# Patient Record
Sex: Female | Born: 1935 | Race: White | Hispanic: No | Marital: Married | Smoking: Never smoker
Health system: Southern US, Community
[De-identification: ages and names within clinical notes are randomized; demographics above are authoritative.]

## PROBLEM LIST (undated history)

## (undated) DIAGNOSIS — I4891 Unspecified atrial fibrillation: Secondary | ICD-10-CM

## (undated) DIAGNOSIS — E7800 Pure hypercholesterolemia, unspecified: Secondary | ICD-10-CM

## (undated) DIAGNOSIS — E119 Type 2 diabetes mellitus without complications: Secondary | ICD-10-CM

## (undated) DIAGNOSIS — I10 Essential (primary) hypertension: Secondary | ICD-10-CM

---

## 1998-07-19 ENCOUNTER — Other Ambulatory Visit: Admission: RE | Admit: 1998-07-19 | Discharge: 1998-07-19 | Payer: Self-pay | Admitting: Internal Medicine

## 1999-08-21 ENCOUNTER — Encounter: Admission: RE | Admit: 1999-08-21 | Discharge: 1999-08-21 | Payer: Self-pay | Admitting: Internal Medicine

## 1999-08-21 ENCOUNTER — Encounter: Payer: Self-pay | Admitting: Internal Medicine

## 2000-10-21 ENCOUNTER — Encounter: Admission: RE | Admit: 2000-10-21 | Discharge: 2000-10-21 | Payer: Self-pay | Admitting: Internal Medicine

## 2000-10-21 ENCOUNTER — Encounter: Payer: Self-pay | Admitting: Internal Medicine

## 2001-03-01 ENCOUNTER — Other Ambulatory Visit: Admission: RE | Admit: 2001-03-01 | Discharge: 2001-03-01 | Payer: Self-pay | Admitting: Internal Medicine

## 2001-11-05 ENCOUNTER — Encounter: Payer: Self-pay | Admitting: Internal Medicine

## 2001-11-05 ENCOUNTER — Encounter: Admission: RE | Admit: 2001-11-05 | Discharge: 2001-11-05 | Payer: Self-pay | Admitting: Internal Medicine

## 2001-11-10 ENCOUNTER — Encounter: Payer: Self-pay | Admitting: Internal Medicine

## 2001-11-10 ENCOUNTER — Encounter: Admission: RE | Admit: 2001-11-10 | Discharge: 2001-11-10 | Payer: Self-pay | Admitting: Internal Medicine

## 2002-11-17 ENCOUNTER — Encounter: Payer: Self-pay | Admitting: Internal Medicine

## 2002-11-17 ENCOUNTER — Encounter: Admission: RE | Admit: 2002-11-17 | Discharge: 2002-11-17 | Payer: Self-pay | Admitting: Internal Medicine

## 2003-11-29 ENCOUNTER — Ambulatory Visit (HOSPITAL_COMMUNITY): Admission: RE | Admit: 2003-11-29 | Discharge: 2003-11-29 | Payer: Self-pay | Admitting: Internal Medicine

## 2004-12-03 ENCOUNTER — Ambulatory Visit (HOSPITAL_COMMUNITY): Admission: RE | Admit: 2004-12-03 | Discharge: 2004-12-03 | Payer: Self-pay | Admitting: Internal Medicine

## 2005-05-27 ENCOUNTER — Ambulatory Visit (HOSPITAL_COMMUNITY): Admission: RE | Admit: 2005-05-27 | Discharge: 2005-05-27 | Payer: Self-pay | Admitting: *Deleted

## 2005-05-27 ENCOUNTER — Encounter (INDEPENDENT_AMBULATORY_CARE_PROVIDER_SITE_OTHER): Payer: Self-pay | Admitting: *Deleted

## 2005-12-11 ENCOUNTER — Ambulatory Visit (HOSPITAL_COMMUNITY): Admission: RE | Admit: 2005-12-11 | Discharge: 2005-12-11 | Payer: Self-pay | Admitting: Internal Medicine

## 2006-12-30 ENCOUNTER — Ambulatory Visit (HOSPITAL_COMMUNITY): Admission: RE | Admit: 2006-12-30 | Discharge: 2006-12-30 | Payer: Self-pay | Admitting: Internal Medicine

## 2007-10-12 ENCOUNTER — Encounter (INDEPENDENT_AMBULATORY_CARE_PROVIDER_SITE_OTHER): Payer: Self-pay | Admitting: *Deleted

## 2007-10-12 ENCOUNTER — Ambulatory Visit (HOSPITAL_COMMUNITY): Admission: RE | Admit: 2007-10-12 | Discharge: 2007-10-12 | Payer: Self-pay | Admitting: *Deleted

## 2007-12-31 ENCOUNTER — Ambulatory Visit (HOSPITAL_COMMUNITY): Admission: RE | Admit: 2007-12-31 | Discharge: 2007-12-31 | Payer: Self-pay | Admitting: Internal Medicine

## 2008-10-21 ENCOUNTER — Encounter: Admission: RE | Admit: 2008-10-21 | Discharge: 2008-10-21 | Payer: Self-pay | Admitting: Internal Medicine

## 2009-01-01 ENCOUNTER — Ambulatory Visit (HOSPITAL_COMMUNITY): Admission: RE | Admit: 2009-01-01 | Discharge: 2009-01-01 | Payer: Self-pay | Admitting: Internal Medicine

## 2009-06-18 ENCOUNTER — Other Ambulatory Visit: Admission: RE | Admit: 2009-06-18 | Discharge: 2009-06-18 | Payer: Self-pay | Admitting: Internal Medicine

## 2010-01-02 ENCOUNTER — Ambulatory Visit (HOSPITAL_COMMUNITY): Admission: RE | Admit: 2010-01-02 | Discharge: 2010-01-02 | Payer: Self-pay | Admitting: Internal Medicine

## 2010-08-06 NOTE — Op Note (Signed)
[name], [name]           ACCOUNT NO.:  [id number]   MEDICAL RECORD NO.:  [id number]          PATIENT TYPE:  AMB   LOCATION:  ENDO                         FACILITY:  [facility]   PHYSICIAN:  Georgiana Spinner, M.D.    DATE OF BIRTH:  1931/09/26   DATE OF PROCEDURE:  10/12/2007  DATE OF DISCHARGE:                               OPERATIVE REPORT   PROCEDURE:  Colonoscopy with polypectomy.   INDICATIONS:  Colon polyps.   ANESTHESIA:  Fentanyl 75 mcg, Versed 5 mg.   PROCEDURE:  With patient mildly sedated in the left lateral decubitus  position, the Pentax videoscopic colonoscope was inserted in the rectum  and passed under direct vision to the cecum identified by ileocecal  valve and appendiceal orifice, both of which were photographed.  There  was an AVM in the cecum which was also photographed.  From this point,  the endoscope was slowly withdrawn taking circumferential views of  colonic mucosa as we withdrew all the way to the rectum, stopping only  in the ascending colon where a small polyp was seen, photographed and  removed using snare cautery technique setting of 20/150 blended current.  We also stopped next in the descending colon where a second polyp was  seen.  It too was photographed and removed using snare cautery technique  and both polyps were retrieved by suctioning through a tissue trap.  In  the rectum, the endoscope was placed in retroflexed view to view the  anal canal from above.  The endoscope was straightened and withdrawn.  The patient's vital signs and pulse oximeter remained stable.  The  patient tolerated the procedure well without apparent complications.   FINDINGS:  1. Polyp of ascending and descending colon.  2. Internal hemorrhoids.  3. Arteriovenous malformation cecum.   PLAN:  Await biopsy reports.  The patient will call me for results and  follow-up with me as needed as an outpatient.           ______________________________  Georgiana Spinner,  M.D.     GMO/MEDQ  D:  10/12/2007  T:  10/12/2007  Job:  [id number]

## 2010-08-09 NOTE — Op Note (Signed)
NAMEDEB, LOUDIN           ACCOUNT NO.:  [id number]   MEDICAL RECORD NO.:  [id number]          PATIENT TYPE:  AMB   LOCATION:  ENDO                         FACILITY:  [facility]   PHYSICIAN:  Georgiana Spinner, M.D.    DATE OF BIRTH:  13-Dec-1931   DATE OF PROCEDURE:  05/27/2005  DATE OF DISCHARGE:                                 OPERATIVE REPORT   PROCEDURE:  Colonoscopy.   ENDOSCOPIST:  Georgiana Spinner, M.D.   INDICATIONS:  Colon polyps.   ANESTHESIA:  None further given.   DESCRIPTION OF PROCEDURE:  With the patient mildly sedated in the left  lateral decubitus position, the Olympus videoscopic colonoscope was inserted  into the rectum and passed under direct vision to the cecum, identified by  crow's foot of the cecum and ileocecal valve, both of which were  photographed.  We entered into the terminal ileum and advanced; this too  appeared normal and was photographed.  From this point, the colonoscope was  slowly withdrawn, taking circumferential views of the small bowel and  subsequently colonic mucosa as we withdrew all the way to the rectum,  stopping in the right colon near the hepatic flexure, where a number of  polyps were seen; some were photographed.  All were removed, some with snare-  cautery technique, some with hot-biopsy-forceps technique, all with the  setting of 20/200 blended current.  There was good hemostasis throughout and  the endoscope was withdrawn down all the way to the rectum, which appeared  normal on direct and showed small hemorrhoids on a retroflexed view.  The  endoscope was straightened and withdrawn.  The patient's vital signs and  pulse oximetry remained stable.  The patient tolerated the procedure well  without apparent complications.   FINDINGS:  A number of small polyps, 3-4 in count, of the right colon near  the hepatic flexure and just slightly distal to this, await biopsy report.   DISCHARGE INSTRUCTIONS:  The patient will call me for  results and follow up  with me as an outpatient.           ______________________________  Georgiana Spinner, M.D.     GMO/MEDQ  D:  05/27/2005  T:  05/28/2005  Job:  [id number]

## 2010-08-09 NOTE — Op Note (Signed)
NAMECHARLOT, GOUIN           ACCOUNT NO.:  [id number]   MEDICAL RECORD NO.:  [id number]          PATIENT TYPE:  AMB   LOCATION:  ENDO                         FACILITY:  [facility]   PHYSICIAN:  Georgiana Spinner, M.D.    DATE OF BIRTH:  May 11, 1931   DATE OF PROCEDURE:  05/27/2005  DATE OF DISCHARGE:                                 OPERATIVE REPORT   PROCEDURE PERFORMED:  Upper endoscopy.   INDICATIONS FOR PROCEDURE:  Abdominal pain.   ANESTHESIA:  Demerol 50 mg, Versed 5 mg.   DESCRIPTION OF PROCEDURE:  With the patient mildly sedated in the left  lateral decubitus position, the Olympus video endoscope was inserted in the  mouth, passed under direct vision through the esophagus, which appeared  normal except for beginning of possible early esophageal stricture. It was  photographed.  There was no evidence of Barrett's esophagus.  We entered  into the stomach.  Fundus, body, antrum, duodenal bulb and second portion of  the duodenum were visualized.  From this point the endoscope was slowly  withdrawn, taking circumferential views of the duodenal mucosa until the  endoscope had been pulled back in the stomach, placed on retroflexion to  view the stomach from below.  The endoscope was then straightened and  withdrawn, taking circumferential views of the remaining gastric and  esophageal mucosa.  Patient's vital signs and pulse oximeter remained  stable.  The patient tolerated the procedure well without apparent  complications.   FINDINGS:  Unremarkable examination.   PLAN:  Proceed to colonoscopy.           ______________________________  Georgiana Spinner, M.D.     GMO/MEDQ  D:  05/27/2005  T:  05/28/2005  Job:  [id number]

## 2010-11-29 ENCOUNTER — Other Ambulatory Visit (HOSPITAL_COMMUNITY): Payer: Self-pay | Admitting: Internal Medicine

## 2010-11-29 DIAGNOSIS — Z1231 Encounter for screening mammogram for malignant neoplasm of breast: Secondary | ICD-10-CM

## 2011-01-06 ENCOUNTER — Ambulatory Visit (HOSPITAL_COMMUNITY)
Admission: RE | Admit: 2011-01-06 | Discharge: 2011-01-06 | Disposition: A | Discharge status: Home / Self Care | Payer: Medicare Other | Source: Ambulatory Visit | Attending: Internal Medicine | Admitting: Internal Medicine

## 2011-01-06 DIAGNOSIS — Z1231 Encounter for screening mammogram for malignant neoplasm of breast: Secondary | ICD-10-CM | POA: Insufficient documentation

## 2011-06-05 DIAGNOSIS — E7800 Pure hypercholesterolemia, unspecified: Secondary | ICD-10-CM | POA: Diagnosis not present

## 2011-06-05 DIAGNOSIS — E119 Type 2 diabetes mellitus without complications: Secondary | ICD-10-CM | POA: Diagnosis not present

## 2011-06-05 DIAGNOSIS — I10 Essential (primary) hypertension: Secondary | ICD-10-CM | POA: Diagnosis not present

## 2011-06-11 DIAGNOSIS — E119 Type 2 diabetes mellitus without complications: Secondary | ICD-10-CM | POA: Diagnosis not present

## 2011-06-11 DIAGNOSIS — E875 Hyperkalemia: Secondary | ICD-10-CM | POA: Diagnosis not present

## 2011-06-11 DIAGNOSIS — I10 Essential (primary) hypertension: Secondary | ICD-10-CM | POA: Diagnosis not present

## 2011-06-11 DIAGNOSIS — E785 Hyperlipidemia, unspecified: Secondary | ICD-10-CM | POA: Diagnosis not present

## 2011-09-01 DIAGNOSIS — H43399 Other vitreous opacities, unspecified eye: Secondary | ICD-10-CM | POA: Diagnosis not present

## 2011-09-01 DIAGNOSIS — H04129 Dry eye syndrome of unspecified lacrimal gland: Secondary | ICD-10-CM | POA: Diagnosis not present

## 2011-09-01 DIAGNOSIS — H35369 Drusen (degenerative) of macula, unspecified eye: Secondary | ICD-10-CM | POA: Diagnosis not present

## 2011-09-01 DIAGNOSIS — H524 Presbyopia: Secondary | ICD-10-CM | POA: Diagnosis not present

## 2011-09-01 DIAGNOSIS — H2510 Age-related nuclear cataract, unspecified eye: Secondary | ICD-10-CM | POA: Diagnosis not present

## 2011-12-09 ENCOUNTER — Other Ambulatory Visit (HOSPITAL_COMMUNITY): Payer: Self-pay | Admitting: Internal Medicine

## 2011-12-09 DIAGNOSIS — Z1231 Encounter for screening mammogram for malignant neoplasm of breast: Secondary | ICD-10-CM

## 2011-12-12 DIAGNOSIS — E785 Hyperlipidemia, unspecified: Secondary | ICD-10-CM | POA: Diagnosis not present

## 2011-12-12 DIAGNOSIS — I10 Essential (primary) hypertension: Secondary | ICD-10-CM | POA: Diagnosis not present

## 2011-12-12 DIAGNOSIS — E119 Type 2 diabetes mellitus without complications: Secondary | ICD-10-CM | POA: Diagnosis not present

## 2011-12-17 DIAGNOSIS — E7800 Pure hypercholesterolemia, unspecified: Secondary | ICD-10-CM | POA: Diagnosis not present

## 2011-12-17 DIAGNOSIS — I10 Essential (primary) hypertension: Secondary | ICD-10-CM | POA: Diagnosis not present

## 2011-12-17 DIAGNOSIS — E119 Type 2 diabetes mellitus without complications: Secondary | ICD-10-CM | POA: Diagnosis not present

## 2011-12-17 DIAGNOSIS — Z23 Encounter for immunization: Secondary | ICD-10-CM | POA: Diagnosis not present

## 2012-01-07 ENCOUNTER — Ambulatory Visit (HOSPITAL_COMMUNITY)
Admission: RE | Admit: 2012-01-07 | Discharge: 2012-01-07 | Disposition: A | Discharge status: Home / Self Care | Payer: Medicare Other | Source: Ambulatory Visit | Attending: Internal Medicine | Admitting: Internal Medicine

## 2012-01-07 DIAGNOSIS — Z1231 Encounter for screening mammogram for malignant neoplasm of breast: Secondary | ICD-10-CM | POA: Diagnosis not present

## 2012-06-10 DIAGNOSIS — M949 Disorder of cartilage, unspecified: Secondary | ICD-10-CM | POA: Diagnosis not present

## 2012-06-10 DIAGNOSIS — M899 Disorder of bone, unspecified: Secondary | ICD-10-CM | POA: Diagnosis not present

## 2012-06-10 DIAGNOSIS — I10 Essential (primary) hypertension: Secondary | ICD-10-CM | POA: Diagnosis not present

## 2012-06-10 DIAGNOSIS — E119 Type 2 diabetes mellitus without complications: Secondary | ICD-10-CM | POA: Diagnosis not present

## 2012-06-15 DIAGNOSIS — M542 Cervicalgia: Secondary | ICD-10-CM | POA: Diagnosis not present

## 2012-06-15 DIAGNOSIS — E119 Type 2 diabetes mellitus without complications: Secondary | ICD-10-CM | POA: Diagnosis not present

## 2012-06-15 DIAGNOSIS — I10 Essential (primary) hypertension: Secondary | ICD-10-CM | POA: Diagnosis not present

## 2012-06-15 DIAGNOSIS — E7800 Pure hypercholesterolemia, unspecified: Secondary | ICD-10-CM | POA: Diagnosis not present

## 2012-06-15 DIAGNOSIS — M47812 Spondylosis without myelopathy or radiculopathy, cervical region: Secondary | ICD-10-CM | POA: Diagnosis not present

## 2012-06-15 DIAGNOSIS — M5030 Other cervical disc degeneration, unspecified cervical region: Secondary | ICD-10-CM | POA: Diagnosis not present

## 2012-09-02 DIAGNOSIS — H43399 Other vitreous opacities, unspecified eye: Secondary | ICD-10-CM | POA: Diagnosis not present

## 2012-09-02 DIAGNOSIS — H04129 Dry eye syndrome of unspecified lacrimal gland: Secondary | ICD-10-CM | POA: Diagnosis not present

## 2012-09-02 DIAGNOSIS — H353190 Nonexudative age-related macular degeneration, unspecified eye, stage unspecified: Secondary | ICD-10-CM | POA: Diagnosis not present

## 2012-09-02 DIAGNOSIS — H2510 Age-related nuclear cataract, unspecified eye: Secondary | ICD-10-CM | POA: Diagnosis not present

## 2012-10-13 DIAGNOSIS — Z86010 Personal history of colonic polyps: Secondary | ICD-10-CM | POA: Diagnosis not present

## 2012-10-26 DIAGNOSIS — K649 Unspecified hemorrhoids: Secondary | ICD-10-CM | POA: Diagnosis not present

## 2012-10-26 DIAGNOSIS — D126 Benign neoplasm of colon, unspecified: Secondary | ICD-10-CM | POA: Diagnosis not present

## 2012-10-26 DIAGNOSIS — Z1211 Encounter for screening for malignant neoplasm of colon: Secondary | ICD-10-CM | POA: Diagnosis not present

## 2012-10-26 DIAGNOSIS — K5730 Diverticulosis of large intestine without perforation or abscess without bleeding: Secondary | ICD-10-CM | POA: Diagnosis not present

## 2012-10-26 DIAGNOSIS — K5520 Angiodysplasia of colon without hemorrhage: Secondary | ICD-10-CM | POA: Diagnosis not present

## 2012-10-26 DIAGNOSIS — Z86010 Personal history of colonic polyps: Secondary | ICD-10-CM | POA: Diagnosis not present

## 2012-12-08 ENCOUNTER — Other Ambulatory Visit (HOSPITAL_COMMUNITY): Payer: Self-pay | Admitting: Internal Medicine

## 2012-12-08 DIAGNOSIS — Z1231 Encounter for screening mammogram for malignant neoplasm of breast: Secondary | ICD-10-CM

## 2012-12-13 DIAGNOSIS — I10 Essential (primary) hypertension: Secondary | ICD-10-CM | POA: Diagnosis not present

## 2012-12-13 DIAGNOSIS — E119 Type 2 diabetes mellitus without complications: Secondary | ICD-10-CM | POA: Diagnosis not present

## 2012-12-16 DIAGNOSIS — E119 Type 2 diabetes mellitus without complications: Secondary | ICD-10-CM | POA: Diagnosis not present

## 2012-12-16 DIAGNOSIS — E875 Hyperkalemia: Secondary | ICD-10-CM | POA: Diagnosis not present

## 2012-12-16 DIAGNOSIS — I10 Essential (primary) hypertension: Secondary | ICD-10-CM | POA: Diagnosis not present

## 2012-12-16 DIAGNOSIS — E7800 Pure hypercholesterolemia, unspecified: Secondary | ICD-10-CM | POA: Diagnosis not present

## 2012-12-16 DIAGNOSIS — Z23 Encounter for immunization: Secondary | ICD-10-CM | POA: Diagnosis not present

## 2013-01-10 ENCOUNTER — Ambulatory Visit (HOSPITAL_COMMUNITY)
Admission: RE | Admit: 2013-01-10 | Discharge: 2013-01-10 | Disposition: A | Discharge status: Home / Self Care | Payer: Medicare Other | Source: Ambulatory Visit | Attending: Internal Medicine | Admitting: Internal Medicine

## 2013-01-10 DIAGNOSIS — Z1231 Encounter for screening mammogram for malignant neoplasm of breast: Secondary | ICD-10-CM | POA: Diagnosis not present

## 2013-06-20 DIAGNOSIS — E559 Vitamin D deficiency, unspecified: Secondary | ICD-10-CM | POA: Diagnosis not present

## 2013-06-20 DIAGNOSIS — E119 Type 2 diabetes mellitus without complications: Secondary | ICD-10-CM | POA: Diagnosis not present

## 2013-06-20 DIAGNOSIS — I10 Essential (primary) hypertension: Secondary | ICD-10-CM | POA: Diagnosis not present

## 2013-06-23 DIAGNOSIS — I10 Essential (primary) hypertension: Secondary | ICD-10-CM | POA: Diagnosis not present

## 2013-06-23 DIAGNOSIS — M949 Disorder of cartilage, unspecified: Secondary | ICD-10-CM | POA: Diagnosis not present

## 2013-06-23 DIAGNOSIS — E7800 Pure hypercholesterolemia, unspecified: Secondary | ICD-10-CM | POA: Diagnosis not present

## 2013-06-23 DIAGNOSIS — E119 Type 2 diabetes mellitus without complications: Secondary | ICD-10-CM | POA: Diagnosis not present

## 2013-06-23 DIAGNOSIS — M899 Disorder of bone, unspecified: Secondary | ICD-10-CM | POA: Diagnosis not present

## 2013-11-01 DIAGNOSIS — E119 Type 2 diabetes mellitus without complications: Secondary | ICD-10-CM | POA: Diagnosis not present

## 2013-11-01 DIAGNOSIS — I10 Essential (primary) hypertension: Secondary | ICD-10-CM | POA: Diagnosis not present

## 2013-11-04 DIAGNOSIS — E7800 Pure hypercholesterolemia, unspecified: Secondary | ICD-10-CM | POA: Diagnosis not present

## 2013-11-04 DIAGNOSIS — M899 Disorder of bone, unspecified: Secondary | ICD-10-CM | POA: Diagnosis not present

## 2013-11-04 DIAGNOSIS — I10 Essential (primary) hypertension: Secondary | ICD-10-CM | POA: Diagnosis not present

## 2013-11-04 DIAGNOSIS — E119 Type 2 diabetes mellitus without complications: Secondary | ICD-10-CM | POA: Diagnosis not present

## 2013-11-18 DIAGNOSIS — H353190 Nonexudative age-related macular degeneration, unspecified eye, stage unspecified: Secondary | ICD-10-CM | POA: Diagnosis not present

## 2013-11-18 DIAGNOSIS — H25019 Cortical age-related cataract, unspecified eye: Secondary | ICD-10-CM | POA: Diagnosis not present

## 2013-11-18 DIAGNOSIS — H40019 Open angle with borderline findings, low risk, unspecified eye: Secondary | ICD-10-CM | POA: Diagnosis not present

## 2013-11-18 DIAGNOSIS — H2510 Age-related nuclear cataract, unspecified eye: Secondary | ICD-10-CM | POA: Diagnosis not present

## 2013-12-12 ENCOUNTER — Other Ambulatory Visit (HOSPITAL_COMMUNITY): Payer: Self-pay | Admitting: Internal Medicine

## 2013-12-12 DIAGNOSIS — Z1231 Encounter for screening mammogram for malignant neoplasm of breast: Secondary | ICD-10-CM

## 2014-01-11 ENCOUNTER — Ambulatory Visit (HOSPITAL_COMMUNITY)
Admission: RE | Admit: 2014-01-11 | Discharge: 2014-01-11 | Disposition: A | Discharge status: Home / Self Care | Payer: Medicare Other | Source: Ambulatory Visit | Attending: Internal Medicine | Admitting: Internal Medicine

## 2014-01-11 DIAGNOSIS — Z1231 Encounter for screening mammogram for malignant neoplasm of breast: Secondary | ICD-10-CM | POA: Insufficient documentation

## 2014-02-21 DIAGNOSIS — H40013 Open angle with borderline findings, low risk, bilateral: Secondary | ICD-10-CM | POA: Diagnosis not present

## 2014-05-05 DIAGNOSIS — I10 Essential (primary) hypertension: Secondary | ICD-10-CM | POA: Diagnosis not present

## 2014-05-05 DIAGNOSIS — M859 Disorder of bone density and structure, unspecified: Secondary | ICD-10-CM | POA: Diagnosis not present

## 2014-05-05 DIAGNOSIS — E118 Type 2 diabetes mellitus with unspecified complications: Secondary | ICD-10-CM | POA: Diagnosis not present

## 2014-05-10 DIAGNOSIS — E559 Vitamin D deficiency, unspecified: Secondary | ICD-10-CM | POA: Diagnosis not present

## 2014-05-10 DIAGNOSIS — I10 Essential (primary) hypertension: Secondary | ICD-10-CM | POA: Diagnosis not present

## 2014-05-10 DIAGNOSIS — E119 Type 2 diabetes mellitus without complications: Secondary | ICD-10-CM | POA: Diagnosis not present

## 2014-05-10 DIAGNOSIS — Z0000 Encounter for general adult medical examination without abnormal findings: Secondary | ICD-10-CM | POA: Diagnosis not present

## 2014-11-08 DIAGNOSIS — H25013 Cortical age-related cataract, bilateral: Secondary | ICD-10-CM | POA: Diagnosis not present

## 2014-11-08 DIAGNOSIS — E119 Type 2 diabetes mellitus without complications: Secondary | ICD-10-CM | POA: Diagnosis not present

## 2014-11-08 DIAGNOSIS — H524 Presbyopia: Secondary | ICD-10-CM | POA: Diagnosis not present

## 2014-11-08 DIAGNOSIS — E780 Pure hypercholesterolemia: Secondary | ICD-10-CM | POA: Diagnosis not present

## 2014-11-08 DIAGNOSIS — H40013 Open angle with borderline findings, low risk, bilateral: Secondary | ICD-10-CM | POA: Diagnosis not present

## 2014-11-08 DIAGNOSIS — E559 Vitamin D deficiency, unspecified: Secondary | ICD-10-CM | POA: Diagnosis not present

## 2014-11-08 DIAGNOSIS — I10 Essential (primary) hypertension: Secondary | ICD-10-CM | POA: Diagnosis not present

## 2014-11-08 DIAGNOSIS — H3531 Nonexudative age-related macular degeneration: Secondary | ICD-10-CM | POA: Diagnosis not present

## 2014-11-14 DIAGNOSIS — M859 Disorder of bone density and structure, unspecified: Secondary | ICD-10-CM | POA: Diagnosis not present

## 2014-11-14 DIAGNOSIS — E119 Type 2 diabetes mellitus without complications: Secondary | ICD-10-CM | POA: Diagnosis not present

## 2014-11-14 DIAGNOSIS — E780 Pure hypercholesterolemia: Secondary | ICD-10-CM | POA: Diagnosis not present

## 2014-11-14 DIAGNOSIS — I10 Essential (primary) hypertension: Secondary | ICD-10-CM | POA: Diagnosis not present

## 2014-12-20 DIAGNOSIS — M79641 Pain in right hand: Secondary | ICD-10-CM | POA: Diagnosis not present

## 2014-12-20 DIAGNOSIS — I10 Essential (primary) hypertension: Secondary | ICD-10-CM | POA: Diagnosis not present

## 2014-12-20 DIAGNOSIS — S6991XA Unspecified injury of right wrist, hand and finger(s), initial encounter: Secondary | ICD-10-CM | POA: Diagnosis not present

## 2015-03-06 ENCOUNTER — Other Ambulatory Visit: Payer: Self-pay

## 2015-03-06 DIAGNOSIS — Z1231 Encounter for screening mammogram for malignant neoplasm of breast: Secondary | ICD-10-CM

## 2015-04-05 ENCOUNTER — Ambulatory Visit
Admission: RE | Admit: 2015-04-05 | Discharge: 2015-04-05 | Disposition: A | Discharge status: Home / Self Care | Payer: Medicare Other | Source: Ambulatory Visit

## 2015-04-05 DIAGNOSIS — Z1231 Encounter for screening mammogram for malignant neoplasm of breast: Secondary | ICD-10-CM

## 2015-05-14 DIAGNOSIS — H04123 Dry eye syndrome of bilateral lacrimal glands: Secondary | ICD-10-CM | POA: Diagnosis not present

## 2015-05-14 DIAGNOSIS — H40013 Open angle with borderline findings, low risk, bilateral: Secondary | ICD-10-CM | POA: Diagnosis not present

## 2015-05-14 DIAGNOSIS — H1013 Acute atopic conjunctivitis, bilateral: Secondary | ICD-10-CM | POA: Diagnosis not present

## 2015-05-14 DIAGNOSIS — H524 Presbyopia: Secondary | ICD-10-CM | POA: Diagnosis not present

## 2015-05-17 DIAGNOSIS — E119 Type 2 diabetes mellitus without complications: Secondary | ICD-10-CM | POA: Diagnosis not present

## 2015-05-17 DIAGNOSIS — I10 Essential (primary) hypertension: Secondary | ICD-10-CM | POA: Diagnosis not present

## 2015-05-17 DIAGNOSIS — E559 Vitamin D deficiency, unspecified: Secondary | ICD-10-CM | POA: Diagnosis not present

## 2015-05-17 DIAGNOSIS — M859 Disorder of bone density and structure, unspecified: Secondary | ICD-10-CM | POA: Diagnosis not present

## 2015-05-22 DIAGNOSIS — I10 Essential (primary) hypertension: Secondary | ICD-10-CM | POA: Diagnosis not present

## 2015-05-22 DIAGNOSIS — E7800 Pure hypercholesterolemia, unspecified: Secondary | ICD-10-CM | POA: Diagnosis not present

## 2015-05-22 DIAGNOSIS — R739 Hyperglycemia, unspecified: Secondary | ICD-10-CM | POA: Diagnosis not present

## 2015-08-30 DIAGNOSIS — H1013 Acute atopic conjunctivitis, bilateral: Secondary | ICD-10-CM | POA: Diagnosis not present

## 2015-08-30 DIAGNOSIS — H40013 Open angle with borderline findings, low risk, bilateral: Secondary | ICD-10-CM | POA: Diagnosis not present

## 2015-08-30 DIAGNOSIS — H04123 Dry eye syndrome of bilateral lacrimal glands: Secondary | ICD-10-CM | POA: Diagnosis not present

## 2015-12-31 DIAGNOSIS — Z23 Encounter for immunization: Secondary | ICD-10-CM | POA: Diagnosis not present

## 2015-12-31 DIAGNOSIS — E7800 Pure hypercholesterolemia, unspecified: Secondary | ICD-10-CM | POA: Diagnosis not present

## 2015-12-31 DIAGNOSIS — E119 Type 2 diabetes mellitus without complications: Secondary | ICD-10-CM | POA: Diagnosis not present

## 2015-12-31 DIAGNOSIS — M859 Disorder of bone density and structure, unspecified: Secondary | ICD-10-CM | POA: Diagnosis not present

## 2015-12-31 DIAGNOSIS — Z0000 Encounter for general adult medical examination without abnormal findings: Secondary | ICD-10-CM | POA: Diagnosis not present

## 2015-12-31 DIAGNOSIS — I10 Essential (primary) hypertension: Secondary | ICD-10-CM | POA: Diagnosis not present

## 2016-01-17 DIAGNOSIS — H25012 Cortical age-related cataract, left eye: Secondary | ICD-10-CM | POA: Diagnosis not present

## 2016-01-17 DIAGNOSIS — H25013 Cortical age-related cataract, bilateral: Secondary | ICD-10-CM | POA: Diagnosis not present

## 2016-01-17 DIAGNOSIS — H2513 Age-related nuclear cataract, bilateral: Secondary | ICD-10-CM | POA: Diagnosis not present

## 2016-01-17 DIAGNOSIS — H40013 Open angle with borderline findings, low risk, bilateral: Secondary | ICD-10-CM | POA: Diagnosis not present

## 2016-01-17 DIAGNOSIS — H2512 Age-related nuclear cataract, left eye: Secondary | ICD-10-CM | POA: Diagnosis not present

## 2016-01-17 DIAGNOSIS — H35363 Drusen (degenerative) of macula, bilateral: Secondary | ICD-10-CM | POA: Diagnosis not present

## 2016-02-26 DIAGNOSIS — H25012 Cortical age-related cataract, left eye: Secondary | ICD-10-CM | POA: Diagnosis not present

## 2016-02-26 DIAGNOSIS — H25812 Combined forms of age-related cataract, left eye: Secondary | ICD-10-CM | POA: Diagnosis not present

## 2016-02-26 DIAGNOSIS — H2512 Age-related nuclear cataract, left eye: Secondary | ICD-10-CM | POA: Diagnosis not present

## 2016-03-04 ENCOUNTER — Other Ambulatory Visit: Payer: Self-pay | Admitting: Internal Medicine

## 2016-03-04 DIAGNOSIS — Z1231 Encounter for screening mammogram for malignant neoplasm of breast: Secondary | ICD-10-CM

## 2016-03-19 DIAGNOSIS — H25011 Cortical age-related cataract, right eye: Secondary | ICD-10-CM | POA: Diagnosis not present

## 2016-03-19 DIAGNOSIS — H2511 Age-related nuclear cataract, right eye: Secondary | ICD-10-CM | POA: Diagnosis not present

## 2016-03-25 DIAGNOSIS — H2511 Age-related nuclear cataract, right eye: Secondary | ICD-10-CM | POA: Diagnosis not present

## 2016-03-25 DIAGNOSIS — H25041 Posterior subcapsular polar age-related cataract, right eye: Secondary | ICD-10-CM | POA: Diagnosis not present

## 2016-03-25 DIAGNOSIS — H25011 Cortical age-related cataract, right eye: Secondary | ICD-10-CM | POA: Diagnosis not present

## 2016-03-25 DIAGNOSIS — H25811 Combined forms of age-related cataract, right eye: Secondary | ICD-10-CM | POA: Diagnosis not present

## 2016-04-29 ENCOUNTER — Ambulatory Visit
Admission: RE | Admit: 2016-04-29 | Discharge: 2016-04-29 | Disposition: A | Discharge status: Home / Self Care | Payer: Medicare Other | Source: Ambulatory Visit | Attending: Internal Medicine | Admitting: Internal Medicine

## 2016-04-29 DIAGNOSIS — Z1231 Encounter for screening mammogram for malignant neoplasm of breast: Secondary | ICD-10-CM | POA: Diagnosis not present

## 2016-06-23 DIAGNOSIS — M859 Disorder of bone density and structure, unspecified: Secondary | ICD-10-CM | POA: Diagnosis not present

## 2016-06-23 DIAGNOSIS — M25571 Pain in right ankle and joints of right foot: Secondary | ICD-10-CM | POA: Diagnosis not present

## 2016-07-01 DIAGNOSIS — E7800 Pure hypercholesterolemia, unspecified: Secondary | ICD-10-CM | POA: Diagnosis not present

## 2016-07-01 DIAGNOSIS — E119 Type 2 diabetes mellitus without complications: Secondary | ICD-10-CM | POA: Diagnosis not present

## 2016-07-01 DIAGNOSIS — N390 Urinary tract infection, site not specified: Secondary | ICD-10-CM | POA: Diagnosis not present

## 2016-07-01 DIAGNOSIS — E559 Vitamin D deficiency, unspecified: Secondary | ICD-10-CM | POA: Diagnosis not present

## 2016-07-01 DIAGNOSIS — I10 Essential (primary) hypertension: Secondary | ICD-10-CM | POA: Diagnosis not present

## 2016-07-04 DIAGNOSIS — M25571 Pain in right ankle and joints of right foot: Secondary | ICD-10-CM | POA: Diagnosis not present

## 2016-07-07 DIAGNOSIS — E119 Type 2 diabetes mellitus without complications: Secondary | ICD-10-CM | POA: Diagnosis not present

## 2016-07-07 DIAGNOSIS — E7800 Pure hypercholesterolemia, unspecified: Secondary | ICD-10-CM | POA: Diagnosis not present

## 2016-07-07 DIAGNOSIS — Z0000 Encounter for general adult medical examination without abnormal findings: Secondary | ICD-10-CM | POA: Diagnosis not present

## 2016-07-07 DIAGNOSIS — I10 Essential (primary) hypertension: Secondary | ICD-10-CM | POA: Diagnosis not present

## 2016-08-01 DIAGNOSIS — M25571 Pain in right ankle and joints of right foot: Secondary | ICD-10-CM | POA: Diagnosis not present

## 2016-10-15 DIAGNOSIS — H35033 Hypertensive retinopathy, bilateral: Secondary | ICD-10-CM | POA: Diagnosis not present

## 2016-10-15 DIAGNOSIS — H35363 Drusen (degenerative) of macula, bilateral: Secondary | ICD-10-CM | POA: Diagnosis not present

## 2016-10-15 DIAGNOSIS — H40013 Open angle with borderline findings, low risk, bilateral: Secondary | ICD-10-CM | POA: Diagnosis not present

## 2016-10-15 DIAGNOSIS — H353132 Nonexudative age-related macular degeneration, bilateral, intermediate dry stage: Secondary | ICD-10-CM | POA: Diagnosis not present

## 2016-12-17 DIAGNOSIS — H40013 Open angle with borderline findings, low risk, bilateral: Secondary | ICD-10-CM | POA: Diagnosis not present

## 2016-12-17 DIAGNOSIS — H04123 Dry eye syndrome of bilateral lacrimal glands: Secondary | ICD-10-CM | POA: Diagnosis not present

## 2016-12-17 DIAGNOSIS — H04212 Epiphora due to excess lacrimation, left lacrimal gland: Secondary | ICD-10-CM | POA: Diagnosis not present

## 2016-12-17 DIAGNOSIS — H1013 Acute atopic conjunctivitis, bilateral: Secondary | ICD-10-CM | POA: Diagnosis not present

## 2017-01-01 DIAGNOSIS — R739 Hyperglycemia, unspecified: Secondary | ICD-10-CM | POA: Diagnosis not present

## 2017-01-01 DIAGNOSIS — E119 Type 2 diabetes mellitus without complications: Secondary | ICD-10-CM | POA: Diagnosis not present

## 2017-01-01 DIAGNOSIS — I10 Essential (primary) hypertension: Secondary | ICD-10-CM | POA: Diagnosis not present

## 2017-01-06 DIAGNOSIS — E119 Type 2 diabetes mellitus without complications: Secondary | ICD-10-CM | POA: Diagnosis not present

## 2017-01-06 DIAGNOSIS — I10 Essential (primary) hypertension: Secondary | ICD-10-CM | POA: Diagnosis not present

## 2017-01-06 DIAGNOSIS — E7800 Pure hypercholesterolemia, unspecified: Secondary | ICD-10-CM | POA: Diagnosis not present

## 2017-01-06 DIAGNOSIS — Z23 Encounter for immunization: Secondary | ICD-10-CM | POA: Diagnosis not present

## 2017-03-31 ENCOUNTER — Other Ambulatory Visit: Payer: Self-pay | Admitting: Internal Medicine

## 2017-03-31 DIAGNOSIS — Z1231 Encounter for screening mammogram for malignant neoplasm of breast: Secondary | ICD-10-CM

## 2017-05-04 ENCOUNTER — Ambulatory Visit
Admission: RE | Admit: 2017-05-04 | Discharge: 2017-05-04 | Disposition: A | Discharge status: Home / Self Care | Payer: Medicare Other | Source: Ambulatory Visit | Attending: Internal Medicine | Admitting: Internal Medicine

## 2017-05-04 DIAGNOSIS — Z1231 Encounter for screening mammogram for malignant neoplasm of breast: Secondary | ICD-10-CM | POA: Diagnosis not present

## 2017-07-09 DIAGNOSIS — I10 Essential (primary) hypertension: Secondary | ICD-10-CM | POA: Diagnosis not present

## 2017-07-09 DIAGNOSIS — E119 Type 2 diabetes mellitus without complications: Secondary | ICD-10-CM | POA: Diagnosis not present

## 2017-07-16 DIAGNOSIS — E875 Hyperkalemia: Secondary | ICD-10-CM | POA: Diagnosis not present

## 2017-07-16 DIAGNOSIS — E7800 Pure hypercholesterolemia, unspecified: Secondary | ICD-10-CM | POA: Diagnosis not present

## 2017-07-16 DIAGNOSIS — Z0000 Encounter for general adult medical examination without abnormal findings: Secondary | ICD-10-CM | POA: Diagnosis not present

## 2017-07-16 DIAGNOSIS — I10 Essential (primary) hypertension: Secondary | ICD-10-CM | POA: Diagnosis not present

## 2017-07-16 DIAGNOSIS — R739 Hyperglycemia, unspecified: Secondary | ICD-10-CM | POA: Diagnosis not present

## 2017-07-16 DIAGNOSIS — Z23 Encounter for immunization: Secondary | ICD-10-CM | POA: Diagnosis not present

## 2017-10-29 DIAGNOSIS — H35363 Drusen (degenerative) of macula, bilateral: Secondary | ICD-10-CM | POA: Diagnosis not present

## 2017-10-29 DIAGNOSIS — H40013 Open angle with borderline findings, low risk, bilateral: Secondary | ICD-10-CM | POA: Diagnosis not present

## 2017-10-29 DIAGNOSIS — H35033 Hypertensive retinopathy, bilateral: Secondary | ICD-10-CM | POA: Diagnosis not present

## 2017-10-29 DIAGNOSIS — H353132 Nonexudative age-related macular degeneration, bilateral, intermediate dry stage: Secondary | ICD-10-CM | POA: Diagnosis not present

## 2018-01-11 DIAGNOSIS — I10 Essential (primary) hypertension: Secondary | ICD-10-CM | POA: Diagnosis not present

## 2018-01-11 DIAGNOSIS — R739 Hyperglycemia, unspecified: Secondary | ICD-10-CM | POA: Diagnosis not present

## 2018-01-19 DIAGNOSIS — E7800 Pure hypercholesterolemia, unspecified: Secondary | ICD-10-CM | POA: Diagnosis not present

## 2018-01-19 DIAGNOSIS — E119 Type 2 diabetes mellitus without complications: Secondary | ICD-10-CM | POA: Diagnosis not present

## 2018-01-19 DIAGNOSIS — I10 Essential (primary) hypertension: Secondary | ICD-10-CM | POA: Diagnosis not present

## 2018-01-27 DIAGNOSIS — M1990 Unspecified osteoarthritis, unspecified site: Secondary | ICD-10-CM | POA: Diagnosis not present

## 2018-02-08 DIAGNOSIS — Z23 Encounter for immunization: Secondary | ICD-10-CM | POA: Diagnosis not present

## 2018-02-24 DIAGNOSIS — M1711 Unilateral primary osteoarthritis, right knee: Secondary | ICD-10-CM | POA: Diagnosis not present

## 2018-02-24 DIAGNOSIS — M1712 Unilateral primary osteoarthritis, left knee: Secondary | ICD-10-CM | POA: Diagnosis not present

## 2018-03-02 DIAGNOSIS — H04123 Dry eye syndrome of bilateral lacrimal glands: Secondary | ICD-10-CM | POA: Diagnosis not present

## 2018-03-02 DIAGNOSIS — H1013 Acute atopic conjunctivitis, bilateral: Secondary | ICD-10-CM | POA: Diagnosis not present

## 2018-03-02 DIAGNOSIS — H40013 Open angle with borderline findings, low risk, bilateral: Secondary | ICD-10-CM | POA: Diagnosis not present

## 2018-04-07 ENCOUNTER — Other Ambulatory Visit: Payer: Self-pay | Admitting: Internal Medicine

## 2018-04-07 DIAGNOSIS — Z1231 Encounter for screening mammogram for malignant neoplasm of breast: Secondary | ICD-10-CM

## 2018-05-11 ENCOUNTER — Ambulatory Visit
Admission: RE | Admit: 2018-05-11 | Discharge: 2018-05-11 | Disposition: A | Discharge status: Home / Self Care | Payer: Medicare Other | Source: Ambulatory Visit | Attending: Internal Medicine | Admitting: Internal Medicine

## 2018-05-11 ENCOUNTER — Ambulatory Visit: Payer: Medicare Other

## 2018-05-11 DIAGNOSIS — Z1231 Encounter for screening mammogram for malignant neoplasm of breast: Secondary | ICD-10-CM

## 2018-07-13 DIAGNOSIS — E7800 Pure hypercholesterolemia, unspecified: Secondary | ICD-10-CM | POA: Diagnosis not present

## 2018-07-13 DIAGNOSIS — E119 Type 2 diabetes mellitus without complications: Secondary | ICD-10-CM | POA: Diagnosis not present

## 2018-07-13 DIAGNOSIS — I10 Essential (primary) hypertension: Secondary | ICD-10-CM | POA: Diagnosis not present

## 2018-07-20 DIAGNOSIS — I10 Essential (primary) hypertension: Secondary | ICD-10-CM | POA: Diagnosis not present

## 2018-07-20 DIAGNOSIS — Z0000 Encounter for general adult medical examination without abnormal findings: Secondary | ICD-10-CM | POA: Diagnosis not present

## 2018-07-20 DIAGNOSIS — E875 Hyperkalemia: Secondary | ICD-10-CM | POA: Diagnosis not present

## 2018-07-20 DIAGNOSIS — E119 Type 2 diabetes mellitus without complications: Secondary | ICD-10-CM | POA: Diagnosis not present

## 2018-07-20 DIAGNOSIS — E7800 Pure hypercholesterolemia, unspecified: Secondary | ICD-10-CM | POA: Diagnosis not present

## 2018-10-21 ENCOUNTER — Other Ambulatory Visit: Payer: Self-pay

## 2018-11-02 DIAGNOSIS — H40013 Open angle with borderline findings, low risk, bilateral: Secondary | ICD-10-CM | POA: Diagnosis not present

## 2018-11-02 DIAGNOSIS — H353132 Nonexudative age-related macular degeneration, bilateral, intermediate dry stage: Secondary | ICD-10-CM | POA: Diagnosis not present

## 2018-11-02 DIAGNOSIS — H35033 Hypertensive retinopathy, bilateral: Secondary | ICD-10-CM | POA: Diagnosis not present

## 2018-11-02 DIAGNOSIS — H35363 Drusen (degenerative) of macula, bilateral: Secondary | ICD-10-CM | POA: Diagnosis not present

## 2019-01-12 DIAGNOSIS — I10 Essential (primary) hypertension: Secondary | ICD-10-CM | POA: Diagnosis not present

## 2019-01-12 DIAGNOSIS — E119 Type 2 diabetes mellitus without complications: Secondary | ICD-10-CM | POA: Diagnosis not present

## 2019-01-12 DIAGNOSIS — E7800 Pure hypercholesterolemia, unspecified: Secondary | ICD-10-CM | POA: Diagnosis not present

## 2019-01-19 DIAGNOSIS — E7800 Pure hypercholesterolemia, unspecified: Secondary | ICD-10-CM | POA: Diagnosis not present

## 2019-01-19 DIAGNOSIS — I10 Essential (primary) hypertension: Secondary | ICD-10-CM | POA: Diagnosis not present

## 2019-01-19 DIAGNOSIS — Z23 Encounter for immunization: Secondary | ICD-10-CM | POA: Diagnosis not present

## 2019-01-19 DIAGNOSIS — R739 Hyperglycemia, unspecified: Secondary | ICD-10-CM | POA: Diagnosis not present

## 2019-04-05 ENCOUNTER — Other Ambulatory Visit: Payer: Self-pay | Admitting: Internal Medicine

## 2019-04-05 DIAGNOSIS — Z1231 Encounter for screening mammogram for malignant neoplasm of breast: Secondary | ICD-10-CM

## 2019-04-13 ENCOUNTER — Ambulatory Visit: Payer: Medicare Other | Attending: Internal Medicine

## 2019-04-13 DIAGNOSIS — Z23 Encounter for immunization: Secondary | ICD-10-CM | POA: Diagnosis not present

## 2019-04-13 NOTE — Progress Notes (Signed)
   Covid-19 Vaccination Clinic  Name:  Ronalyn Kintner    MRN: [id number] DOB: 1931/04/10  04/13/2019  Ms. Tersigni was observed post Covid-19 immunization for 15 minutes without incidence. She was provided with Vaccine Information Sheet and instruction to access the V-Safe system.   Ms. Bertz was instructed to call 911 with any severe reactions post vaccine: Marland Kitchen Difficulty breathing  . Swelling of your face and throat  . A fast heartbeat  . A bad rash all over your body  . Dizziness and weakness    Immunizations Administered    Name Date Dose VIS Date Route   Pfizer COVID-19 Vaccine 04/13/2019  4:06 PM 0.3 mL 03/04/2019 Intramuscular   Manufacturer: [place]   Lot: [id number]   [place]: [id number]

## 2019-05-02 ENCOUNTER — Ambulatory Visit: Payer: Medicare Other | Attending: Internal Medicine

## 2019-05-02 DIAGNOSIS — Z23 Encounter for immunization: Secondary | ICD-10-CM | POA: Insufficient documentation

## 2019-05-02 NOTE — Progress Notes (Signed)
   Covid-19 Vaccination Clinic  Name:  Michele [name]    MRN: [id number] DOB: [birth year]  05/02/2019  Ms. Michele [name] was observed post Covid-19 immunization for 15 minutes without incidence. She was provided with Vaccine Information Sheet and instruction to access the V-Safe system.   Ms. Michele [name] was instructed to call 911 with any severe reactions post vaccine: Marland Kitchen Difficulty breathing  . Swelling of your face and throat  . A fast heartbeat  . A bad rash all over your body  . Dizziness and weakness

## 2019-05-13 ENCOUNTER — Ambulatory Visit
Admission: RE | Admit: 2019-05-13 | Discharge: 2019-05-13 | Disposition: A | Discharge status: Home / Self Care | Payer: Medicare Other | Source: Ambulatory Visit | Attending: Internal Medicine | Admitting: Internal Medicine

## 2019-05-13 ENCOUNTER — Other Ambulatory Visit: Payer: Self-pay

## 2019-05-13 DIAGNOSIS — Z1231 Encounter for screening mammogram for malignant neoplasm of breast: Secondary | ICD-10-CM

## 2019-07-14 DIAGNOSIS — E7800 Pure hypercholesterolemia, unspecified: Secondary | ICD-10-CM | POA: Diagnosis not present

## 2019-07-14 DIAGNOSIS — I10 Essential (primary) hypertension: Secondary | ICD-10-CM | POA: Diagnosis not present

## 2019-07-14 DIAGNOSIS — E119 Type 2 diabetes mellitus without complications: Secondary | ICD-10-CM | POA: Diagnosis not present

## 2019-07-14 DIAGNOSIS — R739 Hyperglycemia, unspecified: Secondary | ICD-10-CM | POA: Diagnosis not present

## 2019-07-28 DIAGNOSIS — I10 Essential (primary) hypertension: Secondary | ICD-10-CM | POA: Diagnosis not present

## 2019-07-28 DIAGNOSIS — E7800 Pure hypercholesterolemia, unspecified: Secondary | ICD-10-CM | POA: Diagnosis not present

## 2019-07-28 DIAGNOSIS — E119 Type 2 diabetes mellitus without complications: Secondary | ICD-10-CM | POA: Diagnosis not present

## 2019-07-28 DIAGNOSIS — Z0000 Encounter for general adult medical examination without abnormal findings: Secondary | ICD-10-CM | POA: Diagnosis not present

## 2019-11-02 DIAGNOSIS — H353132 Nonexudative age-related macular degeneration, bilateral, intermediate dry stage: Secondary | ICD-10-CM | POA: Diagnosis not present

## 2019-11-02 DIAGNOSIS — H35363 Drusen (degenerative) of macula, bilateral: Secondary | ICD-10-CM | POA: Diagnosis not present

## 2019-11-02 DIAGNOSIS — H35033 Hypertensive retinopathy, bilateral: Secondary | ICD-10-CM | POA: Diagnosis not present

## 2019-11-02 DIAGNOSIS — H40013 Open angle with borderline findings, low risk, bilateral: Secondary | ICD-10-CM | POA: Diagnosis not present

## 2020-01-26 DIAGNOSIS — E119 Type 2 diabetes mellitus without complications: Secondary | ICD-10-CM | POA: Diagnosis not present

## 2020-01-26 DIAGNOSIS — E559 Vitamin D deficiency, unspecified: Secondary | ICD-10-CM | POA: Diagnosis not present

## 2020-01-26 DIAGNOSIS — E7800 Pure hypercholesterolemia, unspecified: Secondary | ICD-10-CM | POA: Diagnosis not present

## 2020-01-26 DIAGNOSIS — E875 Hyperkalemia: Secondary | ICD-10-CM | POA: Diagnosis not present

## 2020-01-26 DIAGNOSIS — Z0000 Encounter for general adult medical examination without abnormal findings: Secondary | ICD-10-CM | POA: Diagnosis not present

## 2020-01-26 DIAGNOSIS — I10 Essential (primary) hypertension: Secondary | ICD-10-CM | POA: Diagnosis not present

## 2020-02-02 DIAGNOSIS — I10 Essential (primary) hypertension: Secondary | ICD-10-CM | POA: Diagnosis not present

## 2020-02-02 DIAGNOSIS — E875 Hyperkalemia: Secondary | ICD-10-CM | POA: Diagnosis not present

## 2020-02-02 DIAGNOSIS — E119 Type 2 diabetes mellitus without complications: Secondary | ICD-10-CM | POA: Diagnosis not present

## 2020-02-02 DIAGNOSIS — E7800 Pure hypercholesterolemia, unspecified: Secondary | ICD-10-CM | POA: Diagnosis not present

## 2020-02-02 DIAGNOSIS — Z23 Encounter for immunization: Secondary | ICD-10-CM | POA: Diagnosis not present

## 2020-02-02 DIAGNOSIS — E559 Vitamin D deficiency, unspecified: Secondary | ICD-10-CM | POA: Diagnosis not present

## 2020-02-22 DIAGNOSIS — Z23 Encounter for immunization: Secondary | ICD-10-CM | POA: Diagnosis not present

## 2020-04-16 ENCOUNTER — Other Ambulatory Visit: Payer: Self-pay | Admitting: Internal Medicine

## 2020-04-16 DIAGNOSIS — Z1231 Encounter for screening mammogram for malignant neoplasm of breast: Secondary | ICD-10-CM

## 2020-05-25 ENCOUNTER — Inpatient Hospital Stay: Admission: RE | Admit: 2020-05-25 | Payer: Medicare Other | Source: Ambulatory Visit

## 2020-07-17 ENCOUNTER — Other Ambulatory Visit: Payer: Self-pay

## 2020-07-17 ENCOUNTER — Ambulatory Visit
Admission: RE | Admit: 2020-07-17 | Discharge: 2020-07-17 | Disposition: A | Discharge status: Home / Self Care | Payer: Medicare Other | Source: Ambulatory Visit | Attending: Internal Medicine | Admitting: Internal Medicine

## 2020-07-17 DIAGNOSIS — Z1231 Encounter for screening mammogram for malignant neoplasm of breast: Secondary | ICD-10-CM | POA: Diagnosis not present

## 2020-07-31 DIAGNOSIS — Z0000 Encounter for general adult medical examination without abnormal findings: Secondary | ICD-10-CM | POA: Diagnosis not present

## 2020-07-31 DIAGNOSIS — M859 Disorder of bone density and structure, unspecified: Secondary | ICD-10-CM | POA: Diagnosis not present

## 2020-07-31 DIAGNOSIS — E7800 Pure hypercholesterolemia, unspecified: Secondary | ICD-10-CM | POA: Diagnosis not present

## 2020-07-31 DIAGNOSIS — E119 Type 2 diabetes mellitus without complications: Secondary | ICD-10-CM | POA: Diagnosis not present

## 2020-09-18 DIAGNOSIS — E7800 Pure hypercholesterolemia, unspecified: Secondary | ICD-10-CM | POA: Diagnosis not present

## 2020-09-18 DIAGNOSIS — I10 Essential (primary) hypertension: Secondary | ICD-10-CM | POA: Diagnosis not present

## 2020-09-18 DIAGNOSIS — E119 Type 2 diabetes mellitus without complications: Secondary | ICD-10-CM | POA: Diagnosis not present

## 2020-09-18 DIAGNOSIS — E559 Vitamin D deficiency, unspecified: Secondary | ICD-10-CM | POA: Diagnosis not present

## 2020-11-01 DIAGNOSIS — H353132 Nonexudative age-related macular degeneration, bilateral, intermediate dry stage: Secondary | ICD-10-CM | POA: Diagnosis not present

## 2020-11-01 DIAGNOSIS — H35033 Hypertensive retinopathy, bilateral: Secondary | ICD-10-CM | POA: Diagnosis not present

## 2020-11-01 DIAGNOSIS — E119 Type 2 diabetes mellitus without complications: Secondary | ICD-10-CM | POA: Diagnosis not present

## 2020-11-01 DIAGNOSIS — H40013 Open angle with borderline findings, low risk, bilateral: Secondary | ICD-10-CM | POA: Diagnosis not present

## 2020-12-05 DIAGNOSIS — E7800 Pure hypercholesterolemia, unspecified: Secondary | ICD-10-CM | POA: Diagnosis not present

## 2020-12-05 DIAGNOSIS — E119 Type 2 diabetes mellitus without complications: Secondary | ICD-10-CM | POA: Diagnosis not present

## 2020-12-12 DIAGNOSIS — M8580 Other specified disorders of bone density and structure, unspecified site: Secondary | ICD-10-CM | POA: Diagnosis not present

## 2020-12-12 DIAGNOSIS — I10 Essential (primary) hypertension: Secondary | ICD-10-CM | POA: Diagnosis not present

## 2020-12-12 DIAGNOSIS — Z23 Encounter for immunization: Secondary | ICD-10-CM | POA: Diagnosis not present

## 2020-12-12 DIAGNOSIS — E559 Vitamin D deficiency, unspecified: Secondary | ICD-10-CM | POA: Diagnosis not present

## 2020-12-12 DIAGNOSIS — E7800 Pure hypercholesterolemia, unspecified: Secondary | ICD-10-CM | POA: Diagnosis not present

## 2020-12-12 DIAGNOSIS — Z0000 Encounter for general adult medical examination without abnormal findings: Secondary | ICD-10-CM | POA: Diagnosis not present

## 2020-12-12 DIAGNOSIS — E119 Type 2 diabetes mellitus without complications: Secondary | ICD-10-CM | POA: Diagnosis not present

## 2021-05-23 DIAGNOSIS — H40013 Open angle with borderline findings, low risk, bilateral: Secondary | ICD-10-CM | POA: Diagnosis not present

## 2021-06-04 DIAGNOSIS — E7800 Pure hypercholesterolemia, unspecified: Secondary | ICD-10-CM | POA: Diagnosis not present

## 2021-06-05 DIAGNOSIS — E7800 Pure hypercholesterolemia, unspecified: Secondary | ICD-10-CM | POA: Diagnosis not present

## 2021-06-05 DIAGNOSIS — E119 Type 2 diabetes mellitus without complications: Secondary | ICD-10-CM | POA: Diagnosis not present

## 2021-06-05 DIAGNOSIS — E559 Vitamin D deficiency, unspecified: Secondary | ICD-10-CM | POA: Diagnosis not present

## 2021-06-11 ENCOUNTER — Other Ambulatory Visit: Payer: Self-pay | Admitting: Family Medicine

## 2021-06-11 DIAGNOSIS — Z1231 Encounter for screening mammogram for malignant neoplasm of breast: Secondary | ICD-10-CM

## 2021-06-18 DIAGNOSIS — M8580 Other specified disorders of bone density and structure, unspecified site: Secondary | ICD-10-CM | POA: Diagnosis not present

## 2021-06-18 DIAGNOSIS — E7800 Pure hypercholesterolemia, unspecified: Secondary | ICD-10-CM | POA: Diagnosis not present

## 2021-06-18 DIAGNOSIS — E559 Vitamin D deficiency, unspecified: Secondary | ICD-10-CM | POA: Diagnosis not present

## 2021-06-18 DIAGNOSIS — I10 Essential (primary) hypertension: Secondary | ICD-10-CM | POA: Diagnosis not present

## 2021-06-18 DIAGNOSIS — E119 Type 2 diabetes mellitus without complications: Secondary | ICD-10-CM | POA: Diagnosis not present

## 2021-06-18 DIAGNOSIS — Z780 Asymptomatic menopausal state: Secondary | ICD-10-CM | POA: Diagnosis not present

## 2021-07-18 ENCOUNTER — Ambulatory Visit
Admission: RE | Admit: 2021-07-18 | Discharge: 2021-07-18 | Disposition: A | Discharge status: Home / Self Care | Payer: Medicare Other | Source: Ambulatory Visit | Attending: Family Medicine | Admitting: Family Medicine

## 2021-07-18 DIAGNOSIS — Z1231 Encounter for screening mammogram for malignant neoplasm of breast: Secondary | ICD-10-CM | POA: Diagnosis not present

## 2021-08-17 ENCOUNTER — Emergency Department (HOSPITAL_COMMUNITY)
Admission: EM | Admit: 2021-08-17 | Discharge: 2021-08-18 | Disposition: A | Discharge status: Home / Self Care | Payer: Medicare Other | Attending: Emergency Medicine | Admitting: Emergency Medicine

## 2021-08-17 ENCOUNTER — Encounter (HOSPITAL_COMMUNITY): Payer: Self-pay

## 2021-08-17 ENCOUNTER — Other Ambulatory Visit: Payer: Self-pay

## 2021-08-17 DIAGNOSIS — D72829 Elevated white blood cell count, unspecified: Secondary | ICD-10-CM | POA: Insufficient documentation

## 2021-08-17 DIAGNOSIS — Z7984 Long term (current) use of oral hypoglycemic drugs: Secondary | ICD-10-CM | POA: Diagnosis not present

## 2021-08-17 DIAGNOSIS — I10 Essential (primary) hypertension: Secondary | ICD-10-CM | POA: Insufficient documentation

## 2021-08-17 DIAGNOSIS — E119 Type 2 diabetes mellitus without complications: Secondary | ICD-10-CM | POA: Insufficient documentation

## 2021-08-17 DIAGNOSIS — R1110 Vomiting, unspecified: Secondary | ICD-10-CM | POA: Diagnosis not present

## 2021-08-17 DIAGNOSIS — R112 Nausea with vomiting, unspecified: Secondary | ICD-10-CM | POA: Insufficient documentation

## 2021-08-17 DIAGNOSIS — J9811 Atelectasis: Secondary | ICD-10-CM | POA: Diagnosis not present

## 2021-08-17 DIAGNOSIS — Z79899 Other long term (current) drug therapy: Secondary | ICD-10-CM | POA: Diagnosis not present

## 2021-08-17 DIAGNOSIS — R42 Dizziness and giddiness: Secondary | ICD-10-CM | POA: Insufficient documentation

## 2021-08-17 DIAGNOSIS — R1111 Vomiting without nausea: Secondary | ICD-10-CM | POA: Diagnosis not present

## 2021-08-17 HISTORY — DX: Type 2 diabetes mellitus without complications: E11.9

## 2021-08-17 HISTORY — DX: Essential (primary) hypertension: I10

## 2021-08-17 HISTORY — DX: Unspecified atrial fibrillation: I48.91

## 2021-08-17 HISTORY — DX: Pure hypercholesterolemia, unspecified: E78.00

## 2021-08-17 LAB — CBC
HCT: 39.8 % (ref 36.0–46.0)
Hemoglobin: 13.3 g/dL (ref 12.0–15.0)
MCH: 30.7 pg (ref 26.0–34.0)
MCHC: 33.4 g/dL (ref 30.0–36.0)
MCV: 91.9 fL (ref 80.0–100.0)
Platelets: 152 10*3/uL (ref 150–400)
RBC: 4.33 MIL/uL (ref 3.87–5.11)
RDW: 13.8 % (ref 11.5–15.5)
WBC: 11.1 10*3/uL — ABNORMAL HIGH (ref 4.0–10.5)
nRBC: 0 % (ref 0.0–0.2)

## 2021-08-17 LAB — COMPREHENSIVE METABOLIC PANEL
ALT: 14 U/L (ref 0–44)
AST: 27 U/L (ref 15–41)
Albumin: 3.6 g/dL (ref 3.5–5.0)
Alkaline Phosphatase: 51 U/L (ref 38–126)
Anion gap: 7 (ref 5–15)
BUN: 18 mg/dL (ref 8–23)
CO2: 24 mmol/L (ref 22–32)
Calcium: 9 mg/dL (ref 8.9–10.3)
Chloride: 106 mmol/L (ref 98–111)
Creatinine, Ser: 0.84 mg/dL (ref 0.44–1.00)
GFR, Estimated: >60 mL/min (ref >60)
Glucose, Bld: 158 mg/dL — ABNORMAL HIGH (ref 70–99)
Potassium: 4.5 mmol/L (ref 3.5–5.1)
Sodium: 137 mmol/L (ref 135–145)
Total Bilirubin: 0.7 mg/dL (ref 0.3–1.2)
Total Protein: 5.7 g/dL — ABNORMAL LOW (ref 6.5–8.1)

## 2021-08-17 LAB — LIPASE, BLOOD: Lipase: 30 U/L (ref 11–51)

## 2021-08-17 LAB — CBG MONITORING, ED: Glucose-Capillary: 159 mg/dL — ABNORMAL HIGH (ref 70–99)

## 2021-08-17 MED ORDER — SODIUM CHLORIDE 0.9 % IV BOLUS
1000.0000 mL | Freq: Once | INTRAVENOUS | Status: AC
  Administered 2021-08-17: 1000 mL via INTRAVENOUS

## 2021-08-17 NOTE — ED Triage Notes (Signed)
PT BIB [facility] for Dizzyness/lightheadedness with N/V.  Pt became dizzy and lightheaded around 1630 while at church.  Pt came home and began "violently" throwing up.  EMS reports only dry heaving. Pt did not complain of HA, LOC or and EMS reports not neuro deficits.    EMS gave 500 NS and '4mg'$  Zofran in route    EMS vitals 150/76,  99% RA, HR 70, CBG 170

## 2021-08-17 NOTE — ED Notes (Signed)
Provider at bedside at this time

## 2021-08-18 ENCOUNTER — Emergency Department (HOSPITAL_COMMUNITY): Payer: Medicare Other

## 2021-08-18 DIAGNOSIS — J9811 Atelectasis: Secondary | ICD-10-CM | POA: Diagnosis not present

## 2021-08-18 DIAGNOSIS — R1110 Vomiting, unspecified: Secondary | ICD-10-CM | POA: Diagnosis not present

## 2021-08-18 DIAGNOSIS — R112 Nausea with vomiting, unspecified: Secondary | ICD-10-CM | POA: Diagnosis not present

## 2021-08-18 DIAGNOSIS — R42 Dizziness and giddiness: Secondary | ICD-10-CM | POA: Diagnosis not present

## 2021-08-18 LAB — URINALYSIS, ROUTINE W REFLEX MICROSCOPIC
Bacteria, UA: NONE SEEN
Bilirubin Urine: NEGATIVE
Glucose, UA: NEGATIVE mg/dL
Hgb urine dipstick: NEGATIVE
Ketones, ur: NEGATIVE mg/dL
Nitrite: NEGATIVE
Protein, ur: NEGATIVE mg/dL
Specific Gravity, Urine: 1.017 (ref 1.005–1.030)
pH: 7 (ref 5.0–8.0)

## 2021-08-18 LAB — TROPONIN I (HIGH SENSITIVITY)
Troponin I (High Sensitivity): 6 ng/L (ref <18)
Troponin I (High Sensitivity): 7 ng/L (ref <18)

## 2021-08-18 MED ORDER — ONDANSETRON 4 MG PO TBDP: 4.0000 mg | ORAL_TABLET | Freq: Three times a day (TID) | ORAL | 0 refills | Status: AC | PRN

## 2021-08-18 NOTE — ED Notes (Signed)
After several attempts, this RN was able to reach patient's daughter. Daughter on the way to get patient now.

## 2021-08-18 NOTE — ED Notes (Signed)
Pt ambulated to restroom at this time she advises that we have the wrong number for her daughter that it is [phone] attempted to reach her daughter with that number and there was no answer

## 2021-08-18 NOTE — ED Notes (Signed)
Attempted to reach daughter at this time no answer

## 2021-08-18 NOTE — ED Provider Notes (Signed)
[place] EMERGENCY DEPARTMENT Provider Note  CSN: [id number] Arrival date & time: 08/17/21 2208  Chief Complaint(s) Emesis  HPI Michele [name] is a 88 y.o. female with a past medical history listed below including paroxysmal A-fib on diltiazem (no anticoagulation), type 2 diabetes on metformin, hypertension and hyperlipidemia who presents to the emergency department for sudden onset nausea and vomiting that began 1 hour after dinner.  Patient had several bouts of nonbloody nonbilious emesis.  During these bouts she had generalized fatigue and lightheadedness.  She denied any associated headache.  No chest pain or shortness of breath.  She had abdominal soreness during the vomiting.  Currently denies any abdominal pain.  No urinary symptoms.  No diarrhea. No known sick contacts.  No recent travel.    No other physical complaints.  The history is provided by the patient.   Past Medical History Past Medical History:  Diagnosis Date   A-fib ([place])    Diabetes mellitus without complication ([place])    High cholesterol    Hypertension    There are no problems to display for this patient.  Home Medication(s) Prior to Admission medications   Medication Sig Start Date End Date Taking? Authorizing Provider  atorvastatin (LIPITOR) 40 MG tablet Take 40 mg by mouth every evening. 08/15/21  Yes [provider]  Calcium Carbonate-Vitamin D (CALCIUM-D PO) Take 1 tablet by mouth daily.   Yes [provider]  diltiazem (CARDIZEM CD) 120 MG 24 hr capsule Take 120 mg by mouth daily. 07/11/21  Yes [provider]  losartan (COZAAR) 100 MG tablet Take 100 mg by mouth every evening. 07/11/21  Yes [provider]  metFORMIN (GLUCOPHAGE) 500 MG tablet Take 500 mg by mouth daily. 07/11/21  Yes [provider]  Multiple Vitamins-Minerals (PRESERVISION AREDS PO) Take 2 tablets by mouth daily at 4 PM.   Yes [provider]  naproxen sodium  (ALEVE) 220 MG tablet Take 220 mg by mouth at bedtime as needed (pain).   Yes [provider]  ondansetron (ZOFRAN-ODT) 4 MG disintegrating tablet Take 1 tablet (4 mg total) by mouth every 8 (eight) hours as needed for up to 3 days for nausea or vomiting. 08/18/21 08/21/21 Yes Shanti Eichel, Grayce Sessions, MD  Polyethyl Glycol-Propyl Glycol (SYSTANE OP) Place 1 drop into both eyes daily as needed (dry eyes).   Yes [provider]                                                                                                                                    Allergies Enalapril  Review of Systems Review of Systems As noted in HPI  Physical Exam Vital Signs  I have reviewed the triage vital signs BP 123/64   Pulse 81   Temp 97.9 F (36.6 C) (Oral)   Resp 18   Ht '5\' 2"'$  (1.575 m)   Wt 66.2 kg   SpO2 91%  BMI 26.70 kg/m   Physical Exam Vitals reviewed.  Constitutional:      General: She is not in acute distress.    Appearance: She is well-developed. She is not diaphoretic.  HENT:     Head: Normocephalic and atraumatic.     Nose: Nose normal.  Eyes:     General: No scleral icterus.       Right eye: No discharge.        Left eye: No discharge.     Conjunctiva/sclera: Conjunctivae normal.     Pupils: Pupils are equal, round, and reactive to light.  Cardiovascular:     Rate and Rhythm: Regular rhythm.     Heart sounds: No murmur heard.   No friction rub. No gallop.  Pulmonary:     Effort: Pulmonary effort is normal. No respiratory distress.     Breath sounds: Normal breath sounds. No stridor. No rales.  Abdominal:     General: There is no distension.     Palpations: Abdomen is soft.     Tenderness: There is no abdominal tenderness. There is no guarding or rebound.  Musculoskeletal:        General: No tenderness.     Cervical back: Normal range of motion and neck supple.  Skin:    General: Skin is warm and dry.     Findings: No erythema or rash.   Neurological:     Mental Status: She is alert and oriented to person, place, and time.    ED Results and Treatments Labs (all labs ordered are listed, but only abnormal results are displayed) Labs Reviewed  COMPREHENSIVE METABOLIC PANEL - Abnormal; Notable for the following components:      Result Value   Glucose, Bld 158 (*)    Total Protein 5.7 (*)    All other components within normal limits  CBC - Abnormal; Notable for the following components:   WBC 11.1 (*)    All other components within normal limits  URINALYSIS, ROUTINE W REFLEX MICROSCOPIC - Abnormal; Notable for the following components:   Leukocytes,Ua MODERATE (*)    All other components within normal limits  CBG MONITORING, ED - Abnormal; Notable for the following components:   Glucose-Capillary 159 (*)    All other components within normal limits  LIPASE, BLOOD  TROPONIN I (HIGH SENSITIVITY)  TROPONIN I (HIGH SENSITIVITY)                                                                                                                         EKG  EKG Interpretation  Date/Time:  Saturday Aug 17 2021 22:09:36 EDT Ventricular Rate:  72 PR Interval:  185 QRS Duration: 88 QT Interval:  404 QTC Calculation: 443 R Axis:   -39 Text Interpretation: Sinus rhythm Atrial premature complex Left axis deviation Low voltage, precordial leads No old tracing to compare Confirmed by Addison Lank [phone]) on 08/18/2021 12:06:00 AM       Radiology DG Chest [facility] [address]  Result Date: 08/18/2021 CLINICAL DATA:  Dizziness, lightheadedness.  Vomiting. EXAM: PORTABLE CHEST 1 VIEW COMPARISON:  None Available. FINDINGS: The heart size and mediastinal contours are within normal limits. There is atherosclerotic calcification of the aorta. Lung volumes are low and there is elevation of the right diaphragm. Mild atelectasis is noted at the lung bases. No effusion or pneumothorax. No acute osseous abnormality. IMPRESSION: Low lung volumes  with atelectasis at the lung bases. Electronically Signed   By: Brett Fairy M.D.   On: 08/18/2021 00:21    Pertinent labs & imaging results that were available during my care of the patient were reviewed by me and considered in my medical decision making (see MDM for details).  Medications Ordered in ED Medications  sodium chloride 0.9 % bolus 1,000 mL (0 mLs Intravenous Stopped 08/18/21 0052)                                                                                                                                     Procedures Procedures  (including critical care time)  Medical Decision Making / ED Course    Complexity of Problem:  Co-morbidities/SDOH that complicate the patient evaluation/care: Noted above in HPI   Patient's presenting problem/concern, DDX, and MDM listed below: Nausea and vomiting Associated with generalized fatigue and lightheadedness Abdomen benign. Given age and comorbidities will need to rule out cardiac etiology or atypical ACS. We will also assess for any evidence of biliary obstruction or pancreatitis though I have low suspicion for this. We will assess for any electrolyte or metabolic derangements.. We will also rule out urinary tract infection.  Hospitalization Considered:  Yes if ruled in for ACS, or she is unable to tolerate p.o. intake  Initial Intervention:  IV fluids    Complexity of Data:   Cardiac Monitoring: The patient was maintained on a cardiac monitor.   I personally viewed and interpreted the cardiac monitored which showed an underlying rhythm of normal sinus rhythm with rates in the 80s to 90s.  Intermittent PACs.  Laboratory Tests ordered listed below with my independent interpretation: CBC with mild leukocytosis.  No anemia. Metabolic panel with mild hyperglycemia without evidence of DKA.  No other significant electrolyte derangements or renal sufficiency.  No evidence of bili obstruction or pancreatitis. Troponin neg  x2 UA not convincing for urinary tract infection   Imaging Studies ordered listed below with my independent interpretation: On my read of the chest x-ray, there was no evidence suggestive of pneumonia, pneumothorax, pneumomediastinum, pulmonary edema concerning for new or exacerbation of heart failure, abnormal contour of the mediastinum to suggest dissection, and no evidence of acute injuries.    ED Course:    Assessment, Add'l Intervention, and Reassessment: N/V and lightheadedness Work-up is grossly reassuring.  No evidence of infectious source.  Cardiac work-up was reassuring.  Doubt serious intra-abdominal inflammatory/infectious process or bowel obstruction. After IV hydration, patient reported significant relief.  She was monitored for 8 hours.  She has been able to tolerate oral intake and ambulate to the bathroom without complication.   Final Clinical Impression(s) / ED Diagnoses Final diagnoses:  Nausea and vomiting in adult  Lightheaded   The patient appears reasonably screened and/or stabilized for discharge and I doubt any other medical condition or other [facility] requiring further screening, evaluation, or treatment in the ED at this time prior to discharge. Safe for discharge with strict return precautions.  Disposition: Discharge  Condition: Good  I have discussed the results, Dx and Tx plan with the patient/family who expressed understanding and agree(s) with the plan. Discharge instructions discussed at length. The patient/family was given strict return precautions who verbalized understanding of the instructions. No further questions at time of discharge.    ED Discharge Orders          Ordered    ondansetron (ZOFRAN-ODT) 4 MG disintegrating tablet  Every 8 hours PRN        08/18/21 0600            Follow Up: Jani Gravel, MD [place] [address] [phone]  Call  to schedule an appointment for close follow up            This chart was dictated using voice recognition software.  Despite best efforts to proofread,  errors can occur which can change the documentation meaning.    Fatima Blank, MD 08/18/21 [phone]

## 2021-08-18 NOTE — ED Notes (Signed)
Pt ambulated to restroom with assistance at this time 

## 2021-08-18 NOTE — ED Notes (Signed)
Attempted to call daughter for transportation home and no answer

## 2021-10-31 DIAGNOSIS — E119 Type 2 diabetes mellitus without complications: Secondary | ICD-10-CM | POA: Diagnosis not present

## 2021-10-31 DIAGNOSIS — H40013 Open angle with borderline findings, low risk, bilateral: Secondary | ICD-10-CM | POA: Diagnosis not present

## 2021-10-31 DIAGNOSIS — H04123 Dry eye syndrome of bilateral lacrimal glands: Secondary | ICD-10-CM | POA: Diagnosis not present

## 2021-10-31 DIAGNOSIS — H353132 Nonexudative age-related macular degeneration, bilateral, intermediate dry stage: Secondary | ICD-10-CM | POA: Diagnosis not present

## 2021-12-10 DIAGNOSIS — Z0000 Encounter for general adult medical examination without abnormal findings: Secondary | ICD-10-CM | POA: Diagnosis not present

## 2021-12-10 DIAGNOSIS — E7800 Pure hypercholesterolemia, unspecified: Secondary | ICD-10-CM | POA: Diagnosis not present

## 2021-12-10 DIAGNOSIS — E119 Type 2 diabetes mellitus without complications: Secondary | ICD-10-CM | POA: Diagnosis not present

## 2021-12-17 DIAGNOSIS — I10 Essential (primary) hypertension: Secondary | ICD-10-CM | POA: Diagnosis not present

## 2021-12-17 DIAGNOSIS — Z23 Encounter for immunization: Secondary | ICD-10-CM | POA: Diagnosis not present

## 2021-12-17 DIAGNOSIS — E875 Hyperkalemia: Secondary | ICD-10-CM | POA: Diagnosis not present

## 2021-12-17 DIAGNOSIS — E119 Type 2 diabetes mellitus without complications: Secondary | ICD-10-CM | POA: Diagnosis not present

## 2021-12-17 DIAGNOSIS — E7800 Pure hypercholesterolemia, unspecified: Secondary | ICD-10-CM | POA: Diagnosis not present

## 2021-12-17 DIAGNOSIS — Z0000 Encounter for general adult medical examination without abnormal findings: Secondary | ICD-10-CM | POA: Diagnosis not present

## 2021-12-17 DIAGNOSIS — E559 Vitamin D deficiency, unspecified: Secondary | ICD-10-CM | POA: Diagnosis not present

## 2022-05-06 DIAGNOSIS — H40013 Open angle with borderline findings, low risk, bilateral: Secondary | ICD-10-CM | POA: Diagnosis not present

## 2022-06-10 DIAGNOSIS — E559 Vitamin D deficiency, unspecified: Secondary | ICD-10-CM | POA: Diagnosis not present

## 2022-06-10 DIAGNOSIS — E119 Type 2 diabetes mellitus without complications: Secondary | ICD-10-CM | POA: Diagnosis not present

## 2022-06-10 DIAGNOSIS — E7800 Pure hypercholesterolemia, unspecified: Secondary | ICD-10-CM | POA: Diagnosis not present

## 2022-06-10 DIAGNOSIS — Z0000 Encounter for general adult medical examination without abnormal findings: Secondary | ICD-10-CM | POA: Diagnosis not present

## 2022-06-12 ENCOUNTER — Other Ambulatory Visit: Payer: Self-pay | Admitting: Internal Medicine

## 2022-06-12 DIAGNOSIS — Z1231 Encounter for screening mammogram for malignant neoplasm of breast: Secondary | ICD-10-CM

## 2022-06-17 DIAGNOSIS — I10 Essential (primary) hypertension: Secondary | ICD-10-CM | POA: Diagnosis not present

## 2022-06-17 DIAGNOSIS — E119 Type 2 diabetes mellitus without complications: Secondary | ICD-10-CM | POA: Diagnosis not present

## 2022-06-17 DIAGNOSIS — E559 Vitamin D deficiency, unspecified: Secondary | ICD-10-CM | POA: Diagnosis not present

## 2022-06-17 DIAGNOSIS — E7800 Pure hypercholesterolemia, unspecified: Secondary | ICD-10-CM | POA: Diagnosis not present

## 2022-07-06 IMAGING — MG MM DIGITAL SCREENING BILAT W/ TOMO AND CAD
6 of 10 series · 6 of 30 positions shown · non-contrast
Comparison: Previous exam(s).

CLINICAL DATA: Screening.

EXAM:
DIGITAL SCREENING BILATERAL MAMMOGRAM WITH TOMOSYNTHESIS AND CAD
TECHNIQUE: Bilateral screening digital craniocaudal and mediolateral oblique
mammograms were obtained. Bilateral screening digital breast
tomosynthesis was performed. The images were evaluated with
computer-aided detection.

[R MLO synth-2D]
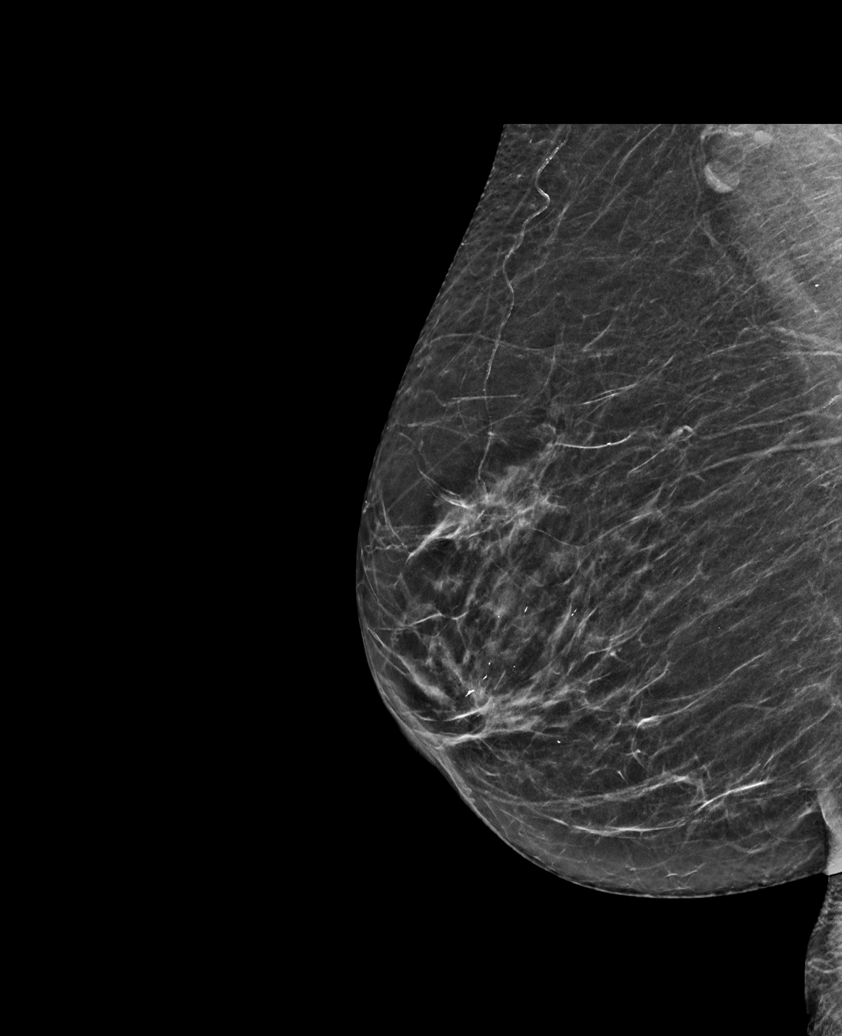

[L MLO synth-2D (1 of 2)]
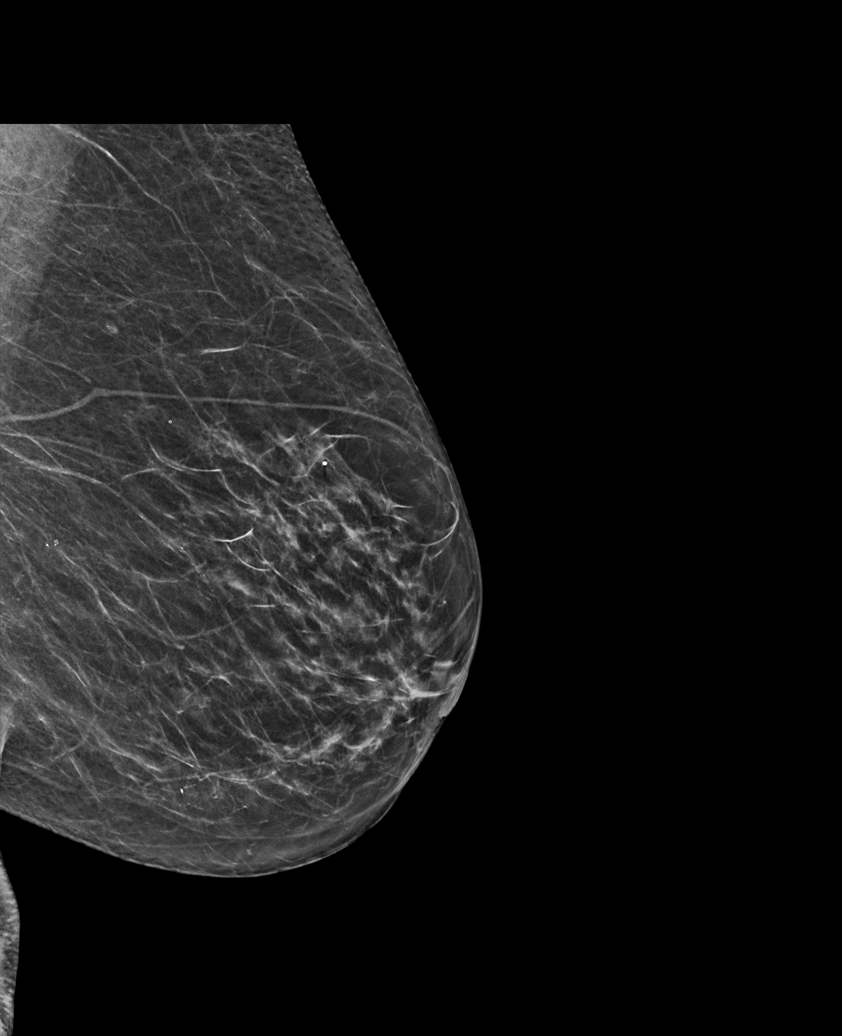

[L MLO synth-2D (2 of 2)]
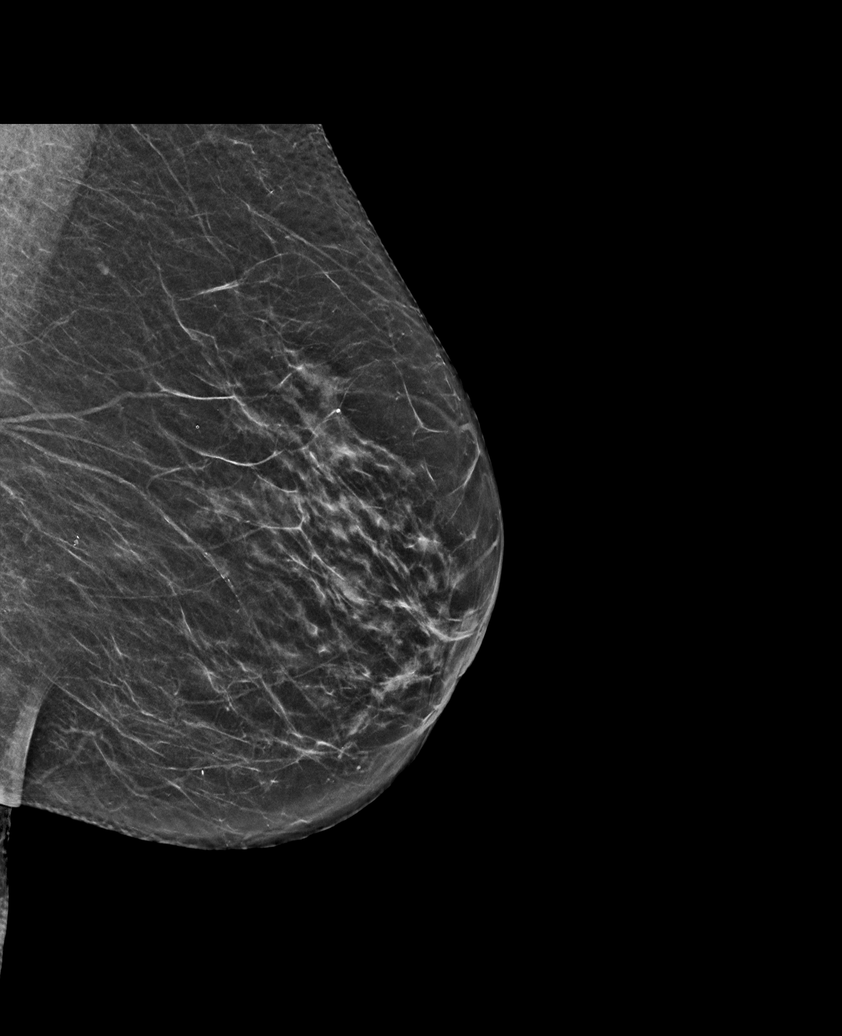

[L CC synth-2D]
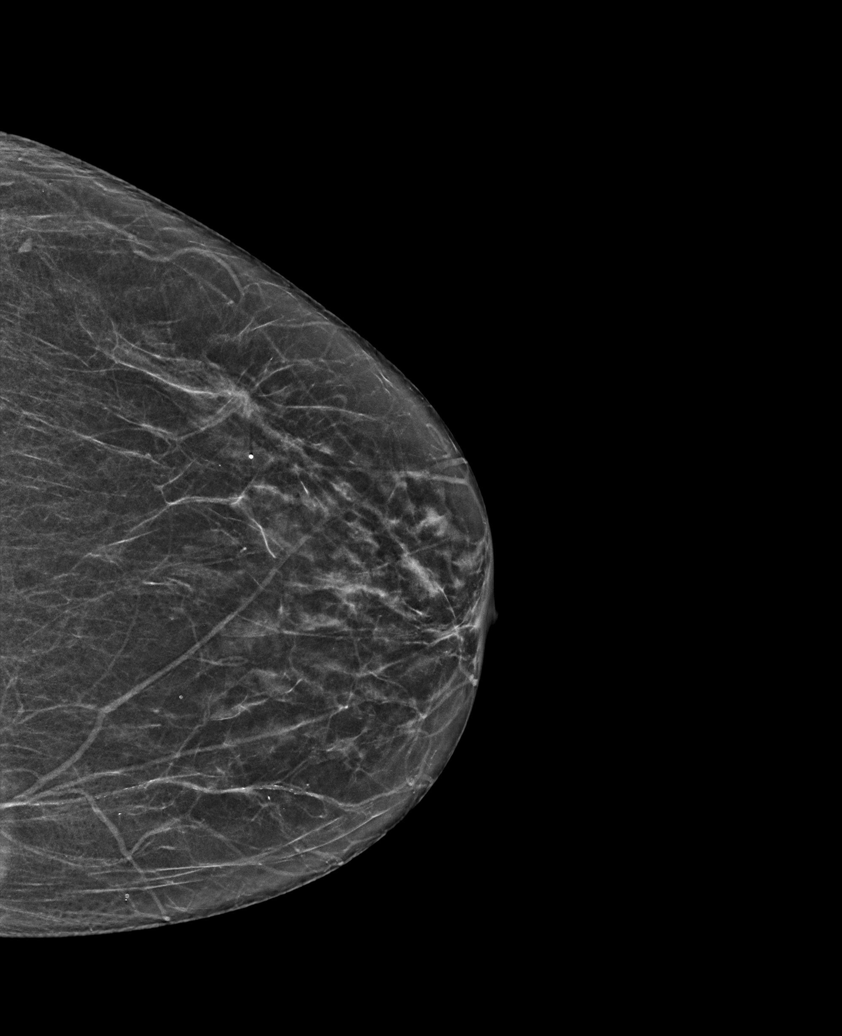

[R CC synth-2D]
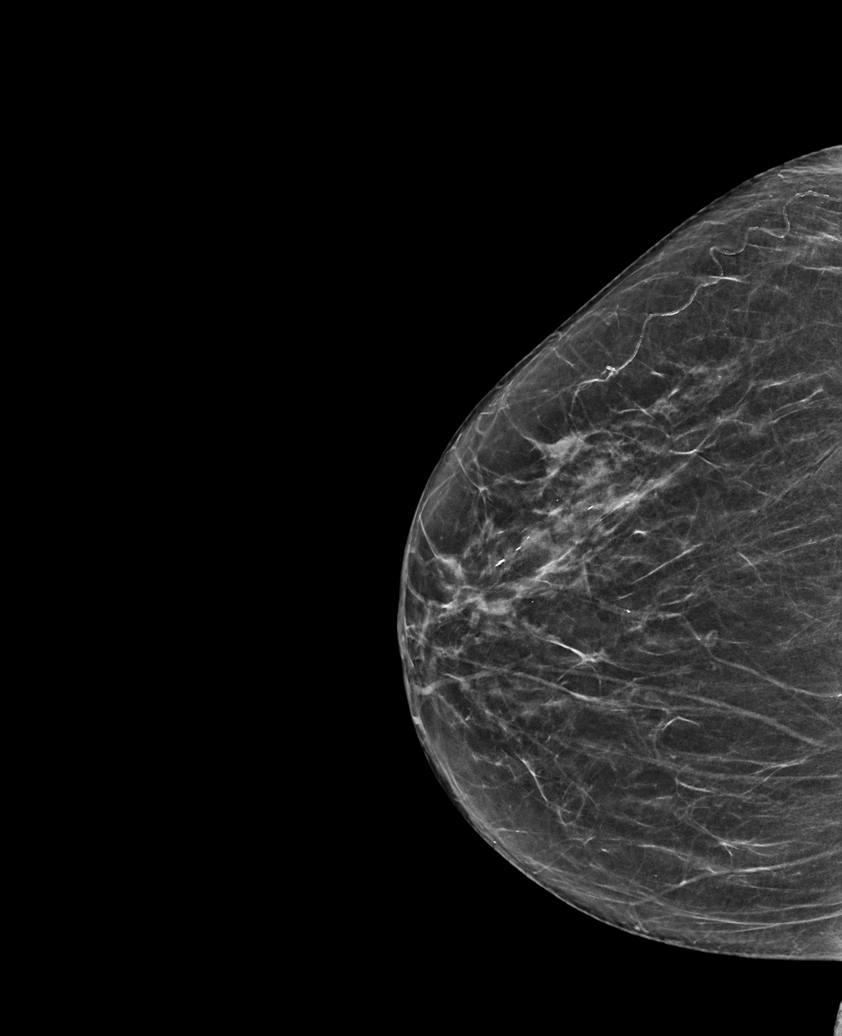

[L CC tomo · tomo slice 28/55.0]
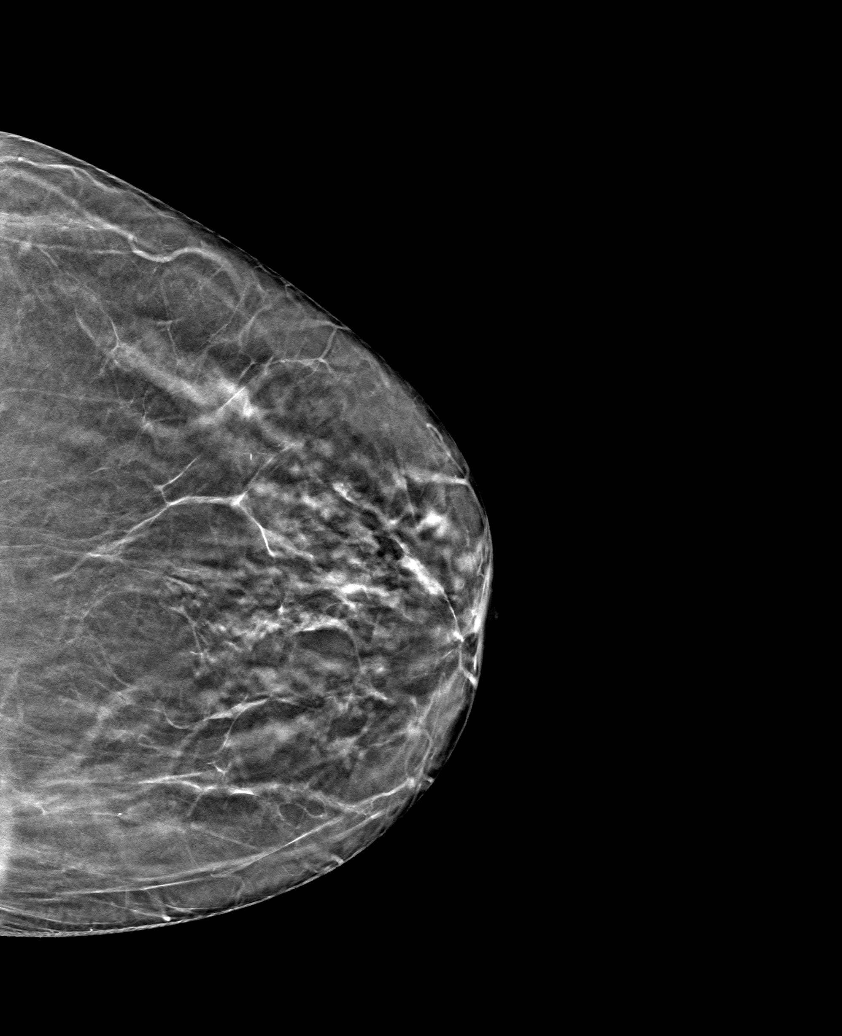

[6 of 30 positions shown; findings below may reference images not displayed]

ACR Breast Density Category c: The breast tissue is heterogeneously
dense, which may obscure small masses.
FINDINGS: There are no findings suspicious for malignancy. The images were
evaluated with computer-aided detection.
IMPRESSION: No mammographic evidence of malignancy. A result letter of this
screening mammogram will be mailed directly to the patient.

RECOMMENDATION:
Screening mammogram in one year. (Code:T4-5-GWO)

BI-RADS CATEGORY  1: Negative.

## 2022-07-28 ENCOUNTER — Ambulatory Visit
Admission: RE | Admit: 2022-07-28 | Discharge: 2022-07-28 | Disposition: A | Discharge status: Home / Self Care | Payer: Medicare Other | Source: Ambulatory Visit | Attending: Internal Medicine | Admitting: Internal Medicine

## 2022-07-28 DIAGNOSIS — Z1231 Encounter for screening mammogram for malignant neoplasm of breast: Secondary | ICD-10-CM | POA: Diagnosis not present

## 2022-11-06 DIAGNOSIS — H04123 Dry eye syndrome of bilateral lacrimal glands: Secondary | ICD-10-CM | POA: Diagnosis not present

## 2022-11-06 DIAGNOSIS — H353132 Nonexudative age-related macular degeneration, bilateral, intermediate dry stage: Secondary | ICD-10-CM | POA: Diagnosis not present

## 2022-11-06 DIAGNOSIS — E119 Type 2 diabetes mellitus without complications: Secondary | ICD-10-CM | POA: Diagnosis not present

## 2022-11-06 DIAGNOSIS — H40013 Open angle with borderline findings, low risk, bilateral: Secondary | ICD-10-CM | POA: Diagnosis not present

## 2022-12-09 DIAGNOSIS — E119 Type 2 diabetes mellitus without complications: Secondary | ICD-10-CM | POA: Diagnosis not present

## 2022-12-09 DIAGNOSIS — E559 Vitamin D deficiency, unspecified: Secondary | ICD-10-CM | POA: Diagnosis not present

## 2022-12-09 DIAGNOSIS — I10 Essential (primary) hypertension: Secondary | ICD-10-CM | POA: Diagnosis not present

## 2022-12-09 DIAGNOSIS — Z0000 Encounter for general adult medical examination without abnormal findings: Secondary | ICD-10-CM | POA: Diagnosis not present

## 2022-12-09 DIAGNOSIS — E7800 Pure hypercholesterolemia, unspecified: Secondary | ICD-10-CM | POA: Diagnosis not present

## 2022-12-24 DIAGNOSIS — E559 Vitamin D deficiency, unspecified: Secondary | ICD-10-CM | POA: Diagnosis not present

## 2022-12-24 DIAGNOSIS — Z0000 Encounter for general adult medical examination without abnormal findings: Secondary | ICD-10-CM | POA: Diagnosis not present

## 2022-12-24 DIAGNOSIS — E7800 Pure hypercholesterolemia, unspecified: Secondary | ICD-10-CM | POA: Diagnosis not present

## 2022-12-24 DIAGNOSIS — G8929 Other chronic pain: Secondary | ICD-10-CM | POA: Diagnosis not present

## 2022-12-24 DIAGNOSIS — M25561 Pain in right knee: Secondary | ICD-10-CM | POA: Diagnosis not present

## 2022-12-24 DIAGNOSIS — I10 Essential (primary) hypertension: Secondary | ICD-10-CM | POA: Diagnosis not present

## 2022-12-24 DIAGNOSIS — Z23 Encounter for immunization: Secondary | ICD-10-CM | POA: Diagnosis not present

## 2023-01-05 DIAGNOSIS — M1711 Unilateral primary osteoarthritis, right knee: Secondary | ICD-10-CM | POA: Diagnosis not present

## 2023-05-11 DIAGNOSIS — H40013 Open angle with borderline findings, low risk, bilateral: Secondary | ICD-10-CM | POA: Diagnosis not present

## 2023-06-22 ENCOUNTER — Other Ambulatory Visit: Payer: Self-pay | Admitting: Registered Nurse

## 2023-06-22 DIAGNOSIS — Z1231 Encounter for screening mammogram for malignant neoplasm of breast: Secondary | ICD-10-CM

## 2023-06-24 DIAGNOSIS — E559 Vitamin D deficiency, unspecified: Secondary | ICD-10-CM | POA: Diagnosis not present

## 2023-06-24 DIAGNOSIS — I10 Essential (primary) hypertension: Secondary | ICD-10-CM | POA: Diagnosis not present

## 2023-06-24 DIAGNOSIS — E119 Type 2 diabetes mellitus without complications: Secondary | ICD-10-CM | POA: Diagnosis not present

## 2023-06-24 DIAGNOSIS — Z0000 Encounter for general adult medical examination without abnormal findings: Secondary | ICD-10-CM | POA: Diagnosis not present

## 2023-07-01 DIAGNOSIS — I10 Essential (primary) hypertension: Secondary | ICD-10-CM | POA: Diagnosis not present

## 2023-07-01 DIAGNOSIS — E119 Type 2 diabetes mellitus without complications: Secondary | ICD-10-CM | POA: Diagnosis not present

## 2023-07-01 DIAGNOSIS — E559 Vitamin D deficiency, unspecified: Secondary | ICD-10-CM | POA: Diagnosis not present

## 2023-07-01 DIAGNOSIS — E7800 Pure hypercholesterolemia, unspecified: Secondary | ICD-10-CM | POA: Diagnosis not present

## 2023-07-29 ENCOUNTER — Ambulatory Visit
Admission: RE | Admit: 2023-07-29 | Discharge: 2023-07-29 | Disposition: A | Discharge status: Home / Self Care | Source: Ambulatory Visit | Attending: Registered Nurse | Admitting: Registered Nurse

## 2023-07-29 DIAGNOSIS — Z1231 Encounter for screening mammogram for malignant neoplasm of breast: Secondary | ICD-10-CM

## 2023-09-02 DIAGNOSIS — M1711 Unilateral primary osteoarthritis, right knee: Secondary | ICD-10-CM | POA: Diagnosis not present

## 2023-11-12 DIAGNOSIS — H353132 Nonexudative age-related macular degeneration, bilateral, intermediate dry stage: Secondary | ICD-10-CM | POA: Diagnosis not present

## 2023-11-12 DIAGNOSIS — H40013 Open angle with borderline findings, low risk, bilateral: Secondary | ICD-10-CM | POA: Diagnosis not present

## 2023-11-12 DIAGNOSIS — H47092 Other disorders of optic nerve, not elsewhere classified, left eye: Secondary | ICD-10-CM | POA: Diagnosis not present

## 2023-11-12 DIAGNOSIS — E119 Type 2 diabetes mellitus without complications: Secondary | ICD-10-CM | POA: Diagnosis not present

## 2023-12-23 DIAGNOSIS — E559 Vitamin D deficiency, unspecified: Secondary | ICD-10-CM | POA: Diagnosis not present

## 2023-12-23 DIAGNOSIS — E7800 Pure hypercholesterolemia, unspecified: Secondary | ICD-10-CM | POA: Diagnosis not present

## 2023-12-23 DIAGNOSIS — Z0000 Encounter for general adult medical examination without abnormal findings: Secondary | ICD-10-CM | POA: Diagnosis not present

## 2023-12-23 DIAGNOSIS — I10 Essential (primary) hypertension: Secondary | ICD-10-CM | POA: Diagnosis not present

## 2023-12-23 DIAGNOSIS — E119 Type 2 diabetes mellitus without complications: Secondary | ICD-10-CM | POA: Diagnosis not present

## 2023-12-30 ENCOUNTER — Observation Stay (HOSPITAL_COMMUNITY): Admission: EM | Admit: 2023-12-30 | Discharge: 2023-12-31 | Disposition: A | Discharge status: Home / Self Care

## 2023-12-30 ENCOUNTER — Other Ambulatory Visit: Payer: Self-pay

## 2023-12-30 ENCOUNTER — Emergency Department (HOSPITAL_COMMUNITY)

## 2023-12-30 ENCOUNTER — Encounter (HOSPITAL_COMMUNITY): Payer: Self-pay | Admitting: *Deleted

## 2023-12-30 DIAGNOSIS — R000 Tachycardia, unspecified: Secondary | ICD-10-CM | POA: Diagnosis present

## 2023-12-30 DIAGNOSIS — E785 Hyperlipidemia, unspecified: Secondary | ICD-10-CM | POA: Insufficient documentation

## 2023-12-30 DIAGNOSIS — Z7984 Long term (current) use of oral hypoglycemic drugs: Secondary | ICD-10-CM | POA: Insufficient documentation

## 2023-12-30 DIAGNOSIS — I471 Supraventricular tachycardia, unspecified: Secondary | ICD-10-CM | POA: Diagnosis not present

## 2023-12-30 DIAGNOSIS — E7800 Pure hypercholesterolemia, unspecified: Secondary | ICD-10-CM | POA: Diagnosis not present

## 2023-12-30 DIAGNOSIS — Z23 Encounter for immunization: Secondary | ICD-10-CM | POA: Diagnosis not present

## 2023-12-30 DIAGNOSIS — I4719 Other supraventricular tachycardia: Secondary | ICD-10-CM | POA: Diagnosis not present

## 2023-12-30 DIAGNOSIS — I10 Essential (primary) hypertension: Secondary | ICD-10-CM | POA: Diagnosis not present

## 2023-12-30 DIAGNOSIS — I4892 Unspecified atrial flutter: Secondary | ICD-10-CM | POA: Diagnosis not present

## 2023-12-30 DIAGNOSIS — E119 Type 2 diabetes mellitus without complications: Secondary | ICD-10-CM | POA: Insufficient documentation

## 2023-12-30 DIAGNOSIS — Z79899 Other long term (current) drug therapy: Secondary | ICD-10-CM | POA: Insufficient documentation

## 2023-12-30 DIAGNOSIS — Z0000 Encounter for general adult medical examination without abnormal findings: Secondary | ICD-10-CM | POA: Diagnosis not present

## 2023-12-30 DIAGNOSIS — R7989 Other specified abnormal findings of blood chemistry: Secondary | ICD-10-CM | POA: Diagnosis not present

## 2023-12-30 DIAGNOSIS — I700 Atherosclerosis of aorta: Secondary | ICD-10-CM | POA: Diagnosis not present

## 2023-12-30 DIAGNOSIS — R079 Chest pain, unspecified: Secondary | ICD-10-CM | POA: Diagnosis not present

## 2023-12-30 DIAGNOSIS — J9811 Atelectasis: Secondary | ICD-10-CM | POA: Diagnosis not present

## 2023-12-30 DIAGNOSIS — I480 Paroxysmal atrial fibrillation: Secondary | ICD-10-CM | POA: Diagnosis not present

## 2023-12-30 DIAGNOSIS — E559 Vitamin D deficiency, unspecified: Secondary | ICD-10-CM | POA: Diagnosis not present

## 2023-12-30 LAB — BASIC METABOLIC PANEL WITH GFR
Anion gap: 12 (ref 5–15)
BUN: 17 mg/dL (ref 8–23)
CO2: 22 mmol/L (ref 22–32)
Calcium: 9.5 mg/dL (ref 8.9–10.3)
Chloride: 104 mmol/L (ref 98–111)
Creatinine, Ser: 0.94 mg/dL (ref 0.44–1.00)
GFR, Estimated: 58 mL/min — ABNORMAL LOW (ref >60)
Glucose, Bld: 123 mg/dL — ABNORMAL HIGH (ref 70–99)
Potassium: 4.2 mmol/L (ref 3.5–5.1)
Sodium: 138 mmol/L (ref 135–145)

## 2023-12-30 LAB — CBC
HCT: 47.7 % — ABNORMAL HIGH (ref 36.0–46.0)
Hemoglobin: 15.6 g/dL — ABNORMAL HIGH (ref 12.0–15.0)
MCH: 30.2 pg (ref 26.0–34.0)
MCHC: 32.7 g/dL (ref 30.0–36.0)
MCV: 92.3 fL (ref 80.0–100.0)
Platelets: 215 K/uL (ref 150–400)
RBC: 5.17 MIL/uL — ABNORMAL HIGH (ref 3.87–5.11)
RDW: 14.3 % (ref 11.5–15.5)
WBC: 9.8 K/uL (ref 4.0–10.5)
nRBC: 0 % (ref 0.0–0.2)

## 2023-12-30 LAB — TROPONIN I (HIGH SENSITIVITY)
Troponin I (High Sensitivity): 11 ng/L (ref <18)
Troponin I (High Sensitivity): 13 ng/L (ref <18)

## 2023-12-30 LAB — BRAIN NATRIURETIC PEPTIDE: B Natriuretic Peptide: 351.8 pg/mL — ABNORMAL HIGH (ref 0.0–100.0)

## 2023-12-30 LAB — MAGNESIUM: Magnesium: 1.9 mg/dL (ref 1.7–2.4)

## 2023-12-30 MED ORDER — ONDANSETRON HCL 4 MG/2ML IJ SOLN: 4.0000 mg | Freq: Four times a day (QID) | INTRAMUSCULAR | Status: DC | PRN

## 2023-12-30 MED ORDER — ACETAMINOPHEN 325 MG PO TABS: 650.0000 mg | ORAL_TABLET | ORAL | Status: DC | PRN

## 2023-12-30 MED ORDER — METOPROLOL TARTRATE 5 MG/5ML IV SOLN
2.5000 mg | INTRAVENOUS | Status: DC | PRN
  Administered 2023-12-30: 2.5 mg via INTRAVENOUS
  Filled 2023-12-30: qty 5

## 2023-12-30 MED ORDER — ENOXAPARIN SODIUM 40 MG/0.4ML IJ SOSY: 40.0000 mg | PREFILLED_SYRINGE | INTRAMUSCULAR | Status: DC

## 2023-12-30 NOTE — ED Triage Notes (Signed)
 The pt was sent from a doctors office for tachycardia  she has not been feeling well for several days  heart rate on arrival was 77 at the doctors office the EKG with them showed a heart rate of 150 rt lower chest pain

## 2023-12-30 NOTE — ED Provider Triage Note (Signed)
 Emergency Medicine Provider Triage Evaluation Note  Michele [name] , a 89 y.o. female  was evaluated in triage.  Pt complains of tachycardia.  Referred by PCP.  No prior history of A-fib or SVT.  Not on blood thinning medicine.  Review of Systems  Positive: As above Negative: As above  Physical Exam  BP (!) 168/105   Pulse (!) 150   Temp 97.7 F (36.5 C)   Resp 18   Wt 66.2 kg   SpO2 99%   BMI 26.69 kg/m  Gen:   Awake, no distress   Resp:  Normal effort  MSK:   Moves extremities without difficulty  Other:    Medical Decision Making  Medically screening exam initiated at 6:09 PM.  Appropriate orders placed.  Clair Tomaro was informed that the remainder of the evaluation will be completed by another provider, this initial triage assessment does not replace that evaluation, and the importance of remaining in the ED until their evaluation is complete.  Nursing aware patient needs a room as soon as possible.   Hildegard Loge, PA-C 12/30/23 1810

## 2023-12-30 NOTE — ED Provider Notes (Signed)
 [place] [facility]PARTMENT AT [facility] Provider Note   CSN: [id number] Arrival date & time: 12/30/23  1712     History {Add pertinent medical, surgical, social history, OB history to HPI:1} Chief Complaint  Patient presents with   Tachycardia    Michele [name] is a 89 y.o. female with paroxysmal A-fib on diltiazem (no anticoagulation), T2DM on metformin, HTN who presents with tachycardia. No CP, SOB, dizziness, lightheadedness, N/V/D, recent illnesses, abdominal pain, urinary sxs, leg swelling. No h/o DVT/PE, no recent travel/hospitalizations/surgeries. No recent changes to medications. She reports no h/o Afib/flutter but her chart does list a history of Afib on diltiazem not on AC.    Past Medical History:  Diagnosis Date   A-fib (HCC)    Diabetes mellitus without complication (HCC)    High cholesterol    Hypertension        Home Medications Prior to Admission medications   Medication Sig Start Date End Date Taking? Authorizing Provider  atorvastatin (LIPITOR) 40 MG tablet Take 40 mg by mouth every evening. 08/15/21   [provider]  Calcium Carbonate-Vitamin D (CALCIUM-D PO) Take 1 tablet by mouth daily.    [provider]  diltiazem (CARDIZEM CD) 120 MG 24 hr capsule Take 120 mg by mouth daily. 07/11/21   [provider]  losartan (COZAAR) 100 MG tablet Take 100 mg by mouth every evening. 07/11/21   [provider]  metFORMIN (GLUCOPHAGE) 500 MG tablet Take 500 mg by mouth daily. 07/11/21   [provider]  Multiple Vitamins-Minerals (PRESERVISION AREDS PO) Take 2 tablets by mouth daily at 4 PM.    [provider]  naproxen sodium (ALEVE) 220 MG tablet Take 220 mg by mouth at bedtime as needed (pain).    [provider]  Polyethyl Glycol-Propyl Glycol (SYSTANE OP) Place 1 drop into both eyes daily as needed (dry eyes).    [provider]      Allergies    Enalapril    Review of  Systems   Review of Systems A 10 point review of systems was performed and is negative unless otherwise reported in HPI.  Physical Exam Updated Vital Signs BP (!) 110/94   Pulse (!) 111   Temp 97.9 F (36.6 C) (Oral)   Resp (!) 28   Ht 5' 2 (1.575 m)   Wt 68 kg   SpO2 99%   BMI 27.44 kg/m  Physical Exam General: Normal appearing {Desc; female/female:11659}, lying in bed.  HEENT: PERRLA, Sclera anicteric, MMM, trachea midline.  Cardiology: RRR, no murmurs/rubs/gallops. BL radial and DP pulses equal bilaterally.  Resp: Normal respiratory rate and effort. CTAB, no wheezes, rhonchi, crackles.  Abd: Soft, non-tender, non-distended. No rebound tenderness or guarding.  GU: Deferred. MSK: No peripheral edema or signs of trauma. Extremities without deformity or TTP. No cyanosis or clubbing. Skin: warm, dry. No rashes or lesions. Back: No CVA tenderness Neuro: A&Ox4, CNs II-XII grossly intact. MAEs. Sensation grossly intact.  Psych: Normal mood and affect.   ED Results / Procedures / Treatments   Labs (all labs ordered are listed, but only abnormal results are displayed) Labs Reviewed  BASIC METABOLIC PANEL WITH GFR - Abnormal; Notable for the following components:      Result Value   Glucose, Bld 123 (*)    GFR, Estimated 58 (*)    All other components within normal limits  CBC - Abnormal; Notable for the following components:   RBC 5.17 (*)    Hemoglobin  15.6 (*)    HCT 47.7 (*)    All other components within normal limits  BRAIN NATRIURETIC PEPTIDE - Abnormal; Notable for the following components:   B Natriuretic Peptide 351.8 (*)    All other components within normal limits  MAGNESIUM  URINALYSIS, ROUTINE W REFLEX MICROSCOPIC  TROPONIN I (HIGH SENSITIVITY)  TROPONIN I (HIGH SENSITIVITY)    EKG None  Radiology DG Chest Portable 1 View Result Date: 12/30/2023 CLINICAL DATA:  tachycardia chest pain EXAM: PORTABLE CHEST 1 VIEW COMPARISON:  Chest x-ray 08/18/2021  FINDINGS: The heart and mediastinal contours are unchanged. Atherosclerotic plaque. Bibasilar atelectasis. No focal consolidation. No pulmonary edema. No pleural effusion. No pneumothorax. No acute osseous abnormality. Degenerative changes of bilateral shoulders. IMPRESSION: 1. No active disease. 2.  Aortic Atherosclerosis (ICD10-I70.0). Electronically Signed   By: Morgane  Naveau M.D.   On: 12/30/2023 19:12    Procedures Procedures  {Document cardiac monitor, telemetry assessment procedure when appropriate:1}  Medications Ordered in ED Medications  metoprolol tartrate (LOPRESSOR) injection 2.5 mg (2.5 mg Intravenous Given 12/30/23 1901)    ED Course/ Medical Decision Making/ A&P                          Medical Decision Making Amount and/or Complexity of Data Reviewed Labs: ordered.  Risk Prescription drug management.    This patient presents to the ED for concern of tachycardia, this involves an extensive number of treatment options, and is a complaint that carries with it a high risk of complications and morbidity.  I considered the following differential and admission for this acute, potentially life threatening condition.   MDM:    Patient w/ aflutter/afib on EKG w/ rate 140-150 bpm.  ***  Clinical Course as of 12/30/23 2013  Wed Dec 30, 2023  2010 After 2.5 mg IV metoprolol, patient's HR decreased from 150s bpm but her BP also dropped into 80s systolic. Her BP is now 110/94 and her HR now 86-120s bpm. Will hold off on additional IV metoprolol now and consult w/ cardiology.  [HN]  2011 Troponin I (High Sensitivity): 11 neg [HN]  2011 No electrolyte derangements. [HN]    Clinical Course User Index [HN] Franklyn Sid SAILOR, MD    Labs: I Ordered, and personally interpreted labs.  The pertinent results include:  those listed above  Imaging Studies ordered: I ordered imaging studies including CXR I independently visualized and interpreted imaging. I agree with the  radiologist interpretation  Additional history obtained from chart review, paperwork at bedside, daughter at bedside.   Cardiac Monitoring: The patient was maintained on a cardiac monitor.  I personally viewed and interpreted the cardiac monitored which showed an underlying rhythm of: A flutter  Reevaluation: After the interventions noted above, I reevaluated the patient and found that they have :improved  Social Determinants of Health: Lives independently  Disposition:  ***  Co morbidities that complicate the patient evaluation  Past Medical History:  Diagnosis Date   A-fib (HCC)    Diabetes mellitus without complication (HCC)    High cholesterol    Hypertension      Medicines Meds ordered this encounter  Medications   metoprolol tartrate (LOPRESSOR) injection 2.5 mg    I have reviewed the patients home medicines and have made adjustments as needed  Problem List / ED Course: Problem List Items Addressed This Visit   None Visit Diagnoses       Atrial flutter with rapid ventricular response (HCC)    -  Primary   Relevant Medications   metoprolol tartrate (LOPRESSOR) injection 2.5 mg            {Document critical care time when appropriate:1} {Document review of labs and clinical decision tools ie heart score, Chads2Vasc2 etc:1}  {Document your independent review of radiology images, and any outside records:1} {Document your discussion with family members, caretakers, and with consultants:1} {Document social determinants of health affecting pt's care:1} {Document your decision making why or why not admission, treatments were needed:1}  This note was created using dictation software, which may contain spelling or grammatical errors.

## 2023-12-30 NOTE — ED Notes (Signed)
 Extra SST, Blue & DG drawn

## 2023-12-31 ENCOUNTER — Observation Stay (HOSPITAL_COMMUNITY)

## 2023-12-31 ENCOUNTER — Observation Stay (INDEPENDENT_AMBULATORY_CARE_PROVIDER_SITE_OTHER)

## 2023-12-31 ENCOUNTER — Other Ambulatory Visit: Payer: Self-pay | Admitting: Student

## 2023-12-31 DIAGNOSIS — I4719 Other supraventricular tachycardia: Secondary | ICD-10-CM

## 2023-12-31 DIAGNOSIS — E119 Type 2 diabetes mellitus without complications: Secondary | ICD-10-CM

## 2023-12-31 DIAGNOSIS — E782 Mixed hyperlipidemia: Secondary | ICD-10-CM | POA: Diagnosis not present

## 2023-12-31 DIAGNOSIS — E785 Hyperlipidemia, unspecified: Secondary | ICD-10-CM | POA: Insufficient documentation

## 2023-12-31 DIAGNOSIS — I10 Essential (primary) hypertension: Secondary | ICD-10-CM

## 2023-12-31 DIAGNOSIS — I150 Renovascular hypertension: Secondary | ICD-10-CM | POA: Diagnosis not present

## 2023-12-31 LAB — URINALYSIS, ROUTINE W REFLEX MICROSCOPIC
Bacteria, UA: NONE SEEN
Bilirubin Urine: NEGATIVE
Glucose, UA: NEGATIVE mg/dL
Hgb urine dipstick: NEGATIVE
Ketones, ur: NEGATIVE mg/dL
Nitrite: NEGATIVE
Protein, ur: NEGATIVE mg/dL
Specific Gravity, Urine: 1.013 (ref 1.005–1.030)
pH: 5 (ref 5.0–8.0)

## 2023-12-31 LAB — TSH: TSH: 0.971 u[IU]/mL (ref 0.350–4.500)

## 2023-12-31 MED ORDER — METOPROLOL SUCCINATE ER 25 MG PO TB24: 25.0000 mg | ORAL_TABLET | Freq: Every day | ORAL | Status: DC

## 2023-12-31 MED ORDER — DILTIAZEM HCL ER COATED BEADS 240 MG PO CP24: 240.0000 mg | ORAL_CAPSULE | Freq: Every day | ORAL | 2 refills | Status: AC

## 2023-12-31 MED ORDER — DILTIAZEM HCL ER COATED BEADS 240 MG PO CP24
240.0000 mg | ORAL_CAPSULE | Freq: Every day | ORAL | Status: DC
  Administered 2023-12-31: 240 mg via ORAL
  Filled 2023-12-31: qty 1

## 2023-12-31 NOTE — Progress Notes (Unsigned)
Enrolled patient for a 14 day Zio XT monitor to be mailed to patients home  DOD to read

## 2023-12-31 NOTE — Discharge Summary (Signed)
 Discharge Summary   Patient ID: Michele [name] MRN: [id number]; DOB: 1931/08/12  Admit date: 12/30/2023 Discharge date: 12/31/2023  PCP:  Royden Ronal Czar, FNP   [facility] Providers Cardiologist:  None       Discharge Diagnoses  Principal Problem:   Atrial tachycardia Active Problems:   Hypertension   Hyperlipidemia   Type 2 diabetes mellitus without complication, without long-term current use of insulin (HCC)   Diagnostic Studies/Procedures  None. _____________   History of Present Illness   Michele [name] is a 89 y.o. female with a history of hypertension, hyperlipidemia, type 2 diabetes mellitus, and arthritis who was admitted on 12/31/2023 for further evaluation of tachycardia.  Patient presented to her PCPs office on 12/30/2023 for a routine physical and was noted to be tachycardic with heart rates in the 150-160s.  She was asymptomatic with this but was advised to go to the ED for further evaluation.  She reported she was able to walk around the mall the day before with a friend for 4-5 hours.  She denied any chest pain or shortness of breath.  EKG showed a narrow complex tachycardia with regular rhythm, rate 96 bpm, with diffuse ST depression likely rate related. High-sensitivity troponin negative x2.  BNP elevated at 381.  Chest x-ray showed no acute findings. WBC 9.8, Hgb 15.6, Plts 215. Na 138, K 4.2, Glucose 123, BUN 17, Cr 0.94. Mag 1.9. Urinalysis showed small leukocytes but was otherwise unremarkable.  There was initially concern for possible atrial flutter versus atrial tachycardia so she was admitted for observation and further evaluation.  Hospital Course   Consultants: None   Atrial Tachycardia Patient was admitted for further ration of tachycardia as above.  There was initially concern for possible atrial flutter but EKG and telemetry more consistent with paroxysmal atrial tachycardia.  Atrial tachycardia will break when she holds her breath.   Electrolytes and TSH normal.  She is completely asymptomatic with this.  Home Diltiazem was increased to 240 mg daily.  Outpatient Echo arranged for next week.  Will also get a 1 week ZIO monitor.  Of note, there is mention of paroxysmal atrial fibrillation but she denies this. Not on any anticoagulation at home. Can assess for this on outpatient monitor.  Hypertension BP mostly well controlled.  Continue home Losartan 100mg  daily and increase Diltiazem to 240 mg daily.  Hyperlipidemia Continue home Lipitor 40mg  daily.  Type 2 Diabetes Mellitus Continue home Metformin.  Patient seen and examined by Dr. Barbaraann today and felt to be stable for discharge. Outpatient follow-up arranged. Medication as below.  Did the patient have an acute coronary syndrome (MI, NSTEMI, STEMI, etc) this admission?:  No                               Did the patient have a percutaneous coronary intervention (stent / angioplasty)?:  No.    _____________  Discharge Vitals Blood pressure (!) 135/94, pulse 83, temperature 98.2 F (36.8 C), temperature source Oral, resp. rate 18, height 5' 2 (1.575 m), weight 68 kg, SpO2 98%.  Filed Weights   12/30/23 1725 12/30/23 1856  Weight: 66.2 kg 68 kg    Labs & Radiologic Studies  CBC Recent Labs    12/30/23 1737  WBC 9.8  HGB 15.6*  HCT 47.7*  MCV 92.3  PLT 215   Basic Metabolic Panel Recent Labs    89/91/74 1737 12/30/23 2236  NA 138  --  K 4.2  --   CL 104  --   CO2 22  --   GLUCOSE 123*  --   BUN 17  --   CREATININE 0.94  --   CALCIUM 9.5  --   MG  --  1.9   Liver Function Tests No results for input(s): AST, ALT, ALKPHOS, BILITOT, PROT, ALBUMIN in the last 72 hours. No results for input(s): LIPASE, AMYLASE in the last 72 hours. High Sensitivity Troponin:   Recent Labs  Lab 12/30/23 1737 12/30/23 2236  TROPONINIHS 11 13    No results for input(s): TRNPT in the last 720 hours.  BNP Invalid input(s): POCBNP No  results for input(s): PROBNP in the last 72 hours.  Recent Labs    12/30/23 1734  BNP 351.8*    D-Dimer No results for input(s): DDIMER in the last 72 hours. Hemoglobin A1C No results for input(s): HGBA1C in the last 72 hours. Fasting Lipid Panel No results for input(s): CHOL, HDL, LDLCALC, TRIG, CHOLHDL, LDLDIRECT in the last 72 hours. No results found for: LIPOA  Thyroid  Function Tests Recent Labs    12/30/23 2236  TSH 0.971   _____________  DG Chest Portable 1 View Result Date: 12/30/2023 CLINICAL DATA:  tachycardia chest pain EXAM: PORTABLE CHEST 1 VIEW COMPARISON:  Chest x-ray 08/18/2021 FINDINGS: The heart and mediastinal contours are unchanged. Atherosclerotic plaque. Bibasilar atelectasis. No focal consolidation. No pulmonary edema. No pleural effusion. No pneumothorax. No acute osseous abnormality. Degenerative changes of bilateral shoulders. IMPRESSION: 1. No active disease. 2.  Aortic Atherosclerosis (ICD10-I70.0). Electronically Signed   By: Morgane  Naveau M.D.   On: 12/30/2023 19:12    Disposition Patient is being discharged home today in good condition.  Follow-up Plans & Appointments  Follow-up Information     [facility] of [employer] Follow up.   Specialty: Cardiology Why: Echocardiogram scheduled for 01/07/2024 at 12:50pm. Please arrive 15 minutes early for check-in. If this date/ time does not work for you, please call our office to reschedule. Contact information: [address] [place]  [address] [phone]        Lorean Ekstrand E, PA-C Follow up.   Specialty: Cardiology Why: Hospital follow-up with Cardiology scheduled for 01/19/2024 at 1:55pm. Please arrive 15 minutes early for check-in. If this date/ time does not work for you, please call our office to reschedule. Contact information: [address] [place] [phone]                 Discharge Instructions     Diet - low sodium heart healthy   Complete by: As directed    Increase activity slowly   Complete by: As directed        Discharge Medications Allergies as of 12/31/2023       Reactions   Enalapril Cough        Medication List     TAKE these medications    atorvastatin 40 MG tablet Commonly known as: LIPITOR Take 40 mg by mouth every evening.   CALCIUM-D PO Take 1 tablet by mouth daily.   diltiazem 240 MG 24 hr capsule Commonly known as: CARDIZEM CD Take 1 capsule (240 mg total) by mouth daily. What changed:  medication strength how much to take   losartan 100 MG tablet Commonly known as: COZAAR Take 100 mg by mouth every evening.   metFORMIN 500 MG tablet Commonly known as: GLUCOPHAGE Take 500 mg by  mouth daily.   naproxen sodium 220 MG tablet Commonly known as: ALEVE Take 220 mg by mouth at bedtime as needed (pain).   PRESERVISION AREDS PO Take 2 tablets by mouth daily at 4 PM.   SYSTANE OP Place 1 drop into both eyes daily as needed (dry eyes).         Outstanding Labs/Studies   Duration of Discharge Encounter: APP Time: 25 minutes   Signed, Binyomin Brann E Danese Dorsainvil, PA-C 12/31/2023, 11:03 AM

## 2023-12-31 NOTE — H&P (Signed)
 Cardiology Admission History and Physical   Patient ID: Michele [name] MRN: [id number]; DOB: 1932/03/19   Admission date: 12/30/2023  PCP:  Royden Ronal Czar, FNP   [facility] Providers Cardiologist:  None     Chief Complaint:  Tachycardia  Patient Profile: Michele [name] is a 89 y.o. female with htn and hld who is being seen 12/31/2023 for the evaluation of tachycardia.  History of Present Illness: Michele [name] was in her usual state of health when she went to a scheduled outpatient appointment. Her HR was found to be in the 160s.   She otherwise feels fine and was able to walk around the mall yst for 5 hours. She states she has no chest pain or shortness of breath. No cardiac history. Has never been told that she has afib. She is very active and never has any chest pain or shortness of breath with activity.   Past Medical History:  Diagnosis Date   A-fib (HCC)    Diabetes mellitus without complication (HCC)    High cholesterol    Hypertension    History reviewed. No pertinent surgical history.   Medications Prior to Admission: Prior to Admission medications   Medication Sig Start Date End Date Taking? Authorizing Provider  atorvastatin (LIPITOR) 40 MG tablet Take 40 mg by mouth every evening. 08/15/21  Yes [provider]  Calcium Carbonate-Vitamin D (CALCIUM-D PO) Take 1 tablet by mouth daily.   Yes [provider]  diltiazem (CARDIZEM CD) 120 MG 24 hr capsule Take 120 mg by mouth daily. 07/11/21  Yes [provider]  losartan (COZAAR) 100 MG tablet Take 100 mg by mouth every evening. 07/11/21  Yes [provider]  metFORMIN (GLUCOPHAGE) 500 MG tablet Take 500 mg by mouth daily. 07/11/21  Yes [provider]  Multiple Vitamins-Minerals (PRESERVISION AREDS PO) Take 2 tablets by mouth daily at 4 PM.   Yes [provider]  naproxen sodium (ALEVE) 220 MG tablet Take 220 mg by mouth at bedtime as needed  (pain).   Yes [provider]  Polyethyl Glycol-Propyl Glycol (SYSTANE OP) Place 1 drop into both eyes daily as needed (dry eyes).   Yes [provider]     Allergies:    Allergies  Allergen Reactions   Enalapril Cough    Social History:   Social History   Socioeconomic History   Marital status: Married    Spouse name: Not on file   Number of children: Not on file   Years of education: Not on file   Highest education level: Not on file  Occupational History   Not on file  Tobacco Use   Smoking status: Never   Smokeless tobacco: Never  Substance and Sexual Activity   Alcohol use: Never   Drug use: Never   Sexual activity: Not Currently  Other Topics Concern   Not on file  Social History Narrative   Not on file   Social Drivers of Health   Financial Resource Strain: Not on file  Food Insecurity: Not on file  Transportation Needs: Not on file  Physical Activity: Not on file  Stress: Not on file  Social Connections: Not on file  Intimate Partner Violence: Not on file     Family History:  The patient's family history is not on file.    ROS:  Please see the history of present illness.  All other ROS reviewed and negative.     Physical Exam/Data: Vitals:   12/30/23 2000 12/30/23 2115  12/30/23 2127 12/31/23 0327  BP: 103/84 115/88  130/82  Pulse: (!) 110 (!) 132  82  Resp: (!) 23 (!) 30  (!) 26  Temp:   97.8 F (36.6 C) 97.8 F (36.6 C)  TempSrc:   Oral   SpO2: 99% 96%  95%  Weight:      Height:       No intake or output data in the 24 hours ending 12/31/23 0401    12/30/2023    6:56 PM 12/30/2023    5:25 PM 08/17/2021   11:35 PM  Last 3 Weights  Weight (lbs) 150 lb 145 lb 15.1 oz 146 lb  Weight (kg) 68.04 kg 66.2 kg 66.225 kg     Body mass index is 27.44 kg/m.  General:  Well nourished, well developed, in no acute distress HEENT: normal Neck: no JVD Vascular: No carotid bruits; Distal pulses 2+ bilaterally   Cardiac:  tachycardic  with occasional dropped beat Lungs:  clear to auscultation bilaterally, no wheezing, rhonchi or rales  Abd: soft, nontender, no hepatomegaly  Ext: no edema Musculoskeletal:  No deformities, BUE and BLE strength normal and equal Skin: warm and dry  Neuro:   no focal abnormalities noted Psych:  Normal affect   EKG:  The ECG that was done was personally reviewed and demonstrates either 2nd degree type 1 AVB or atrial tachycardia with occasional non conducted bea  Relevant CV Studies: TTE pending  Laboratory Data: High Sensitivity Troponin:   Recent Labs  Lab 12/30/23 1737 12/30/23 2236  TROPONINIHS 11 13      Chemistry Recent Labs  Lab 12/30/23 1737 12/30/23 2236  NA 138  --   K 4.2  --   CL 104  --   CO2 22  --   GLUCOSE 123*  --   BUN 17  --   CREATININE 0.94  --   CALCIUM 9.5  --   MG  --  1.9  GFRNONAA 58*  --   ANIONGAP 12  --   \ Hematology Recent Labs  Lab 12/30/23 1737  WBC 9.8  RBC 5.17*  HGB 15.6*  HCT 47.7*  MCV 92.3  MCH 30.2  MCHC 32.7  RDW 14.3  PLT 215   BNP Recent Labs  Lab 12/30/23 1734  BNP 351.8*     Radiology/Studies:  DG Chest Portable 1 View Result Date: 12/30/2023 CLINICAL DATA:  tachycardia chest pain EXAM: PORTABLE CHEST 1 VIEW COMPARISON:  Chest x-ray 08/18/2021 FINDINGS: The heart and mediastinal contours are unchanged. Atherosclerotic plaque. Bibasilar atelectasis. No focal consolidation. No pulmonary edema. No pleural effusion. No pneumothorax. No acute osseous abnormality. Degenerative changes of bilateral shoulders. IMPRESSION: 1. No active disease. 2.  Aortic Atherosclerosis (ICD10-I70.0). Electronically Signed   By: Morgane  Naveau M.D.   On: 12/30/2023 19:12   Assessment and Plan: Tachycardia  The patient presented with tachycardia into the 160s. She is asymptomatic and very active. Her rhyhtm is quite interesting and is either a sinus rhythm with 2nd degree type 1 AVB or atrial tachycardia with nonconducted beats. She  will have variation in her HR so I suspect this is an atrial arrhythmia as she has no reason to be tachycardia. We will start with an echo and likely add a low dose AV nodal blocking agent. I do not suspect she will need to be here in the hospital for too long, and she can probably discharge later during the day on 10/9. She is unsure why she has been given  a prescription of diltiazem and denies ever being told that she has afib. - TTE in AM - Possible beta blocker in AM  Risk Assessment/Risk Scores:   Code Status: Full Code  Severity of Illness: The appropriate patient status for this patient is OBSERVATION. Observation status is judged to be reasonable and necessary in order to provide the required intensity of service to ensure the patient's safety. The patient's presenting symptoms, physical exam findings, and initial radiographic and laboratory data in the context of their medical condition is felt to place them at decreased risk for further clinical deterioration. Furthermore, it is anticipated that the patient will be medically stable for discharge from the hospital within 2 midnights of admission.   For questions or updates, please contact [facility] Please consult [email or web address] for contact info under     Signed, Jerrell DELENA Orchard, MD  12/31/2023 4:01 AM

## 2023-12-31 NOTE — Progress Notes (Signed)
 Ordered Echo and 1 week monitor for further evaluation of atrial tachycardia.  Michele [name] E Larina Lieurance, PA-C 12/31/2023 9:46 AM

## 2023-12-31 NOTE — Discharge Instructions (Addendum)
 Medication Changes: - INCREASE Diltiazem (Cardizem CD) to 240mg  daily. I sent a new prescription to your pharmacy, but you can take two of the 120mg  tablets that you have a home (for a total of 240mg ) until you run out of these.  - Continue other home medications.  - I was able to go ahead and get your Echocardiogram scheduled for 01/07/2024 at 12:50am in our office. Please arrive 15 minutes early for check-in. - The heart monitor will be mailed to you.

## 2024-01-07 ENCOUNTER — Telehealth: Payer: Self-pay | Admitting: Student

## 2024-01-07 ENCOUNTER — Ambulatory Visit (HOSPITAL_COMMUNITY)
Admission: RE | Admit: 2024-01-07 | Discharge: 2024-01-07 | Disposition: A | Discharge status: Home / Self Care | Source: Ambulatory Visit | Attending: Cardiology | Admitting: Cardiology

## 2024-01-07 DIAGNOSIS — I10 Essential (primary) hypertension: Secondary | ICD-10-CM | POA: Diagnosis present

## 2024-01-07 DIAGNOSIS — I480 Paroxysmal atrial fibrillation: Secondary | ICD-10-CM

## 2024-01-07 DIAGNOSIS — R000 Tachycardia, unspecified: Secondary | ICD-10-CM | POA: Diagnosis not present

## 2024-01-07 DIAGNOSIS — I4719 Other supraventricular tachycardia: Secondary | ICD-10-CM | POA: Diagnosis not present

## 2024-01-07 LAB — ECHOCARDIOGRAM COMPLETE
Area-P 1/2: 3.99 cm2
S' Lateral: 2.63 cm

## 2024-01-07 NOTE — Telephone Encounter (Signed)
 Spoke to patient's daughter Hildreth.She stated mother received heart monitor.She is there to help her put on monitor.Stated she really does not want to wear.Advised she needs to wear monitor.Results will help Dr. know how to treat her.Advised if she has any questions when applying monitor to call number on monitor box.

## 2024-01-07 NOTE — Telephone Encounter (Signed)
 Pt received heart monitor for pt and would like to discuss instructions on how to apply. Please Advise

## 2024-01-10 ENCOUNTER — Ambulatory Visit: Payer: Self-pay | Admitting: Student

## 2024-01-13 DIAGNOSIS — M25561 Pain in right knee: Secondary | ICD-10-CM | POA: Diagnosis not present

## 2024-01-14 NOTE — Progress Notes (Signed)
Patient has been notified directly; all questions, if any, were answered. Patient voiced understanding.   

## 2024-01-19 ENCOUNTER — Ambulatory Visit: Admitting: Student

## 2024-01-22 NOTE — Progress Notes (Deleted)
 Cardiology Office Note:    Date:  01/22/2024   ID:  Michele [name], DOB 04-11-1931, MRN [id number]  PCP:  Michele Ronal Czar, FNP  Cardiologist:  Michele ONEIDA Decent, MD Cardiology APP:  Michele Trull E, PA-C { Click to update primary MD,subspecialty MD or APP then REFRESH:1}    Referring MD: Michele Ronal Czar, FNP   Chief Complaint: hospital follow-up of SVT vs atrial tachycardia  History of Present Illness:    Michele [name] is a 89 y.o. female with a history of SVT vs atrial tachycardia, hypertension, hyperlipidemia, type 2 diabetes mellitus, and arthritis who is followed by Dr. Decent and presents today for hospital follow-up of SVT vs atrial tachycardia.   Patient was first seen by Cardiology during recent hospitalization. She was admitted form 12/30/2023 to 12/31/2023 for further evaluation of tachycardia that was first noticed when she was at her PCP's office for a routine physical. Her heart rates were in the 150s to 160s. She was advised to go to the ED for further evaluation. EKG showed a narrow complex tachycardia with regular rhythm, rate 166 bpm. High-sensitivity troponin was negative. Electrolytes and TSH were normal. She was completely asymptomatic with this and reported being able [place] for 4-5 hours the day before admission. There was initially concern for possible atrial flutter but Cardiology reviewed EKG and telemetry and felt this was more consistent with atrial tachycardia vs SVT. She was monitor for several hours and remained stable. Home Cardizem was increased and outpatient Echo and monitor were arranged. Echo showed LVEF of 60-65% with normal wall motion and diastolic dysfunction, normal RV function, and no significant valvular disease. Monitor showed ***  Patient presents today for follow-up. ***  SVT vs Atrial Tachycardia Patient was recently admitted overnight in 12/2023 for evaluation of a narrow complex tachycardia that was felt to  likely be atrial tachycardia vs SVT. Echo showed normal LV function with no significant valvular disease. Monitor showed *** - **** - Continue Cardizem CD 240mg  daily.   Hypertension BP well controlled. *** - Continue Cardizem CD 240mg  daily and Losartan 200mg  daily.   Hyperlipidemia Lipid panel in *** - Continue Lipitor 40mg  daily.  - Managed by PCP.  Type 2 Diabetes Mellitus Hemoglobin A1 *** - On Metformin.  - Managed by PCP.   EKGs/Labs/Other Studies Reviewed:    The following studies were reviewed:  Echocardiogram 01/07/2024: Impressions: 1. Left ventricular ejection fraction, by estimation, is 60 to 65%. The  left ventricle has normal function. The left ventricle has no regional  wall motion abnormalities. Left ventricular diastolic parameters were  normal. The average left ventricular  global longitudinal strain is -20.0 %. The global longitudinal strain is  normal.   2. Right ventricular systolic function is normal. The right ventricular  size is normal.   3. Left atrial size was mildly dilated.   4. The mitral valve is normal in structure. Trivial mitral valve  regurgitation. No evidence of mitral stenosis.   5. The aortic valve is tricuspid. Aortic valve regurgitation is not  visualized. No aortic stenosis is present.   6. The inferior vena cava is normal in size with greater than 50%  respiratory variability, suggesting right atrial pressure of 3 mmHg.  _______________  Monitor ***  EKG:  EKG not ordered today.  Recent Labs: 12/30/2023: B Natriuretic Peptide 351.8; BUN 17; Creatinine, Ser 0.94; Hemoglobin 15.6; Magnesium 1.9; Platelets 215; Potassium 4.2; Sodium 138; TSH 0.971  Recent Lipid Panel No results  found for: CHOL, TRIG, HDL, CHOLHDL, VLDL, LDLCALC, LDLDIRECT  Physical Exam:    Vital Signs: There were no vitals taken for this visit.    Wt Readings from Last 3 Encounters:  12/30/23 150 lb (68 kg)  08/17/21 146 lb (66.2 kg)      General: 89 y.o. female in no acute distress. HEENT: Normocephalic and atraumatic. Sclera clear.  Neck: Supple. No carotid bruits. No JVD. Heart: *** RRR. Distinct S1 and S2. No murmurs, gallops, or rubs.  Lungs: No increased work of breathing. Clear to ausculation bilaterally. No wheezes, rhonchi, or rales.  Abdomen: Soft, non-distended, and non-tender to palpation.  Extremities: No lower extremity edema.  Radial and distal pedal pulses 2+ and equal bilaterally. Skin: Warm and dry. Neuro: No focal deficits. Psych: Normal affect. Responds appropriately.   Assessment:    No diagnosis found.  Plan:     Disposition: Follow up in ***   Signed, Michele FORBES Door, PA-C  01/22/2024 11:10 AM    [facility]

## 2024-01-25 DIAGNOSIS — I4719 Other supraventricular tachycardia: Secondary | ICD-10-CM | POA: Diagnosis not present

## 2024-01-29 DIAGNOSIS — I4719 Other supraventricular tachycardia: Secondary | ICD-10-CM

## 2024-02-03 ENCOUNTER — Ambulatory Visit: Admitting: Student

## 2024-02-03 NOTE — Progress Notes (Signed)
 Spoke with patient and daughter, all questions, if any, were answered. Patient voiced understanding.

## 2024-02-05 NOTE — Progress Notes (Signed)
 Chart was reviewed and patient was being worked up for new A-fib with RVR with symptoms and tachycardia.  She also had some soft blood pressures.  BNP was ordered to look for heart failure.

## 2024-02-25 ENCOUNTER — Ambulatory Visit: Admitting: Physician Assistant

## 2024-03-01 NOTE — Progress Notes (Unsigned)
 Cardiology Office Note:  .   Date:  03/08/2024  ID:  [name], DOB October 31, 1931, MRN [id number] PCP: Royden Ronal Czar, FNP  [facility] Providers Cardiologist:  Darryle ONEIDA Decent, MD Cardiology APP:  Goodrich, Callie E, PA-C    History of Present Illness: .   Michele [name] is a 89 y.o. female  with a history of SVT vs atrial tachycardia, hypertension, hyperlipidemia, type 2 diabetes mellitus, and arthritis who is followed by Dr. Decent and presents today for hospital follow-up of SVT vs atrial tachycardia.   Patient was first seen by Cardiology during recent hospitalization. She was admitted form 12/30/2023 to 12/31/2023 for further evaluation of tachycardia that was first noticed when she was at her PCP's office for a routine physical. Her heart rates were in the 150s to 160s. She was advised to go to the ED for further evaluation. EKG showed a narrow complex tachycardia with regular rhythm, rate 166 bpm. High-sensitivity troponin was negative. Electrolytes and TSH were normal. She was completely asymptomatic with this and reported being able to walk around the mall for 4-5 hours the day before admission. There was initially concern for possible atrial flutter but Cardiology reviewed EKG and telemetry and felt this was more consistent with atrial tachycardia vs SVT. She was monitor for several hours and remained stable. Home Cardizem  was increased and outpatient Echo and monitor were arranged. Echo showed LVEF of 60-65% with normal wall motion and diastolic dysfunction, normal RV function, and no significant valvular disease. Monitor showed frequent SVT/atrial tachycardia.   Patient here with her daughter. Denies chest pain,dyspnea, edema. She remains active in clubs, church, walking, still lives independently.   ROS:    Studies Reviewed: SABRA    EKG Interpretation Date/Time:  Tuesday March 08 2024 12:36:05 EST Ventricular Rate:  97 PR Interval:  160 QRS Duration:  76 QT  Interval:  346 QTC Calculation: 439 R Axis:   -59  Text Interpretation: Sinus rhythm with Premature supraventricular complexes Left axis deviation When compared with ECG of 30-Dec-2023 20:27, PREVIOUS ECG IS PRESENT Confirmed by Parthenia Klinefelter [phone]) on 03/08/2024 12:59:48 PM    Prior CV Studies:   [facility] 01/26/24   Predominant rhythm sinus average heart rate 76 bpm   Frequent SVT, atrial tachycardia, longest lasting 2 minutes with an average heart rate of 123 bpm, fastest at 15 seconds at 171 bpm.   Occasional PACs 4.1%   Rare PVCs less than 1%.   No evidence of atrial fibrillation.     Patch Wear Time:  5 days and 4 hours (2025-10-19T04:29:58-0400 to 2025-10-24T08:38:55-0400)   Patient had a min HR of 51 bpm, max HR of 171 bpm, and avg HR of 76 bpm. Predominant underlying rhythm was Sinus Rhythm. Slight P wave morphology changes were noted. 5212 Supraventricular Tachycardia runs occurred, the run with the fastest interval  lasting 15.0 secs with a max rate of 171 bpm, the longest lasting 2 mins 0 secs with an avg rate of 123 bpm. True duration of Supraventricular Tachycardia difficult to ascertain due to artifact. Isolated SVEs were occasional (4.1%, 20913), SVE Couplets  were occasional (2.4%, 6122), and SVE Triplets were occasional (1.4%, 2383). Isolated VEs were rare (<1.0%), VE Couplets were rare (<1.0%), and no VE Triplets were present. Ventricular Bigeminy was present.    Echocardiogram 01/07/2024: Impressions: 1. Left ventricular ejection fraction, by estimation, is 60 to 65%. The  left ventricle has normal function. The left ventricle has no regional  wall motion abnormalities. Left  ventricular diastolic parameters were  normal. The average left ventricular  global longitudinal strain is -20.0 %. The global longitudinal strain is  normal.   2. Right ventricular systolic function is normal. The right ventricular  size is normal.   3. Left atrial size was mildly dilated.   4.  The mitral valve is normal in structure. Trivial mitral valve  regurgitation. No evidence of mitral stenosis.   5. The aortic valve is tricuspid. Aortic valve regurgitation is not  visualized. No aortic stenosis is present.   6. The inferior vena cava is normal in size with greater than 50%  respiratory variability, suggesting right atrial pressure of 3 mmHg.  _______________  Risk Assessment/Calculations:             Physical Exam:   VS:  BP 126/70   Pulse 97   Ht 5' 2 (1.575 m)   Wt 154 lb 6.4 oz (70 kg)   SpO2 96%   BMI 28.24 kg/m    Orhtostatics: No data found. Wt Readings from Last 3 Encounters:  03/08/24 154 lb 6.4 oz (70 kg)  12/30/23 150 lb (68 kg)  08/17/21 146 lb (66.2 kg)    GEN: Well nourished, well developed in no acute distress NECK: No JVD; No carotid bruits CARDIAC:  RRR, no murmurs, rubs, gallops RESPIRATORY:  Clear to auscultation without rales, wheezing or rhonchi  ABDOMEN: Soft, non-tender, non-distended EXTREMITIES:  No edema; No deformity   ASSESSMENT AND PLAN: .    SVT vs Atrial Tachycardia Patient was recently admitted overnight in 12/2023 for evaluation of a narrow complex tachycardia that was felt to likely be atrial tachycardia vs SVT. Echo showed normal LV function with no significant valvular disease. Monitor showed freqeunt SVT - Continue Cardizem  CD 240mg  daily.  -add low dose Toprol  XL 25 mg qHS  Hypertension BP well controlled.   - Continue Cardizem  CD 240mg  daily and Losartan 200mg  daily. -monitor with addition of metoprolol    Hyperlipidemia Lipid panel in 12/2023 [id number] - Continue Lipitor 40mg  daily.  - Managed by PCP.  Type 2 Diabetes Mellitus Hemoglobin A1 5.9 - On Metformin.  - Managed by PCP.         Dispo: f/u Dr. Anner 4 months  Signed, Olivia Pavy, PA-C

## 2024-03-08 ENCOUNTER — Encounter: Payer: Self-pay | Admitting: Physician Assistant

## 2024-03-08 ENCOUNTER — Ambulatory Visit: Attending: Physician Assistant | Admitting: Physician Assistant

## 2024-03-08 VITALS — BP 126/70 | HR 97 | Ht 62.0 in | Wt 154.4 lb

## 2024-03-08 DIAGNOSIS — I10 Essential (primary) hypertension: Secondary | ICD-10-CM | POA: Insufficient documentation

## 2024-03-08 DIAGNOSIS — E119 Type 2 diabetes mellitus without complications: Secondary | ICD-10-CM | POA: Diagnosis present

## 2024-03-08 DIAGNOSIS — E782 Mixed hyperlipidemia: Secondary | ICD-10-CM | POA: Diagnosis present

## 2024-03-08 DIAGNOSIS — I4719 Other supraventricular tachycardia: Secondary | ICD-10-CM | POA: Insufficient documentation

## 2024-03-08 MED ORDER — METOPROLOL SUCCINATE ER 25 MG PO TB24: 25.0000 mg | ORAL_TABLET | Freq: Every day | ORAL | 3 refills | Status: AC

## 2024-03-08 NOTE — Patient Instructions (Signed)
 Medication Instructions:  Start Metoprolol  XL 25mg  daily at bedtime.  Please call if you have any side affects.  *If you need a refill on your cardiac medications before your next appointment, please call your pharmacy*  Lab Work: None  If you have labs (blood work) drawn today and your tests are completely normal, you will receive your results only by: MyChart Message (if you have MyChart) OR A paper copy in the mail If you have any lab test that is abnormal or we need to change your treatment, we will call you to review the results.  Testing/Procedures: None   Follow-Up: At [facility], you and your health needs are our priority.  As part of our continuing mission to provide you with exceptional heart care, our providers are all part of one team.  This team includes your primary Cardiologist (physician) and Advanced Practice Providers or APPs (Physician Assistants and Nurse Practitioners) who all work together to provide you with the care you need, when you need it.  Your next appointment:   4 month(s)  Provider:   Darryle ONEIDA Decent, MD    We recommend signing up for the patient portal called MyChart.  Sign up information is provided on this After Visit Summary.  MyChart is used to connect with patients for Virtual Visits (Telemedicine).  Patients are able to view lab/test results, encounter notes, upcoming appointments, etc.  Non-urgent messages can be sent to your provider as well.   To learn more about what you can do with MyChart, go to [email or web address].   Other Instructions None
